# Patient Record
Sex: Male | Born: 1975 | Hispanic: No | Marital: Married | Smoking: Never smoker
Health system: Southern US, Community
[De-identification: ages and names within clinical notes are randomized; demographics above are authoritative.]

## PROBLEM LIST (undated history)

## (undated) DIAGNOSIS — Z8739 Personal history of other diseases of the musculoskeletal system and connective tissue: Secondary | ICD-10-CM

## (undated) DIAGNOSIS — K219 Gastro-esophageal reflux disease without esophagitis: Secondary | ICD-10-CM

## (undated) DIAGNOSIS — I10 Essential (primary) hypertension: Secondary | ICD-10-CM

## (undated) DIAGNOSIS — E785 Hyperlipidemia, unspecified: Secondary | ICD-10-CM

## (undated) HISTORY — DX: Gastro-esophageal reflux disease without esophagitis: K21.9

## (undated) HISTORY — DX: Hyperlipidemia, unspecified: E78.5

## (undated) HISTORY — DX: Personal history of other diseases of the musculoskeletal system and connective tissue: Z87.39

## (undated) HISTORY — DX: Essential (primary) hypertension: I10

---

## 2010-08-29 ENCOUNTER — Emergency Department: Payer: Self-pay | Admitting: Emergency Medicine

## 2011-06-23 ENCOUNTER — Emergency Department: Payer: Self-pay | Admitting: *Deleted

## 2011-06-28 ENCOUNTER — Encounter: Payer: Self-pay | Admitting: Family Medicine

## 2011-06-28 ENCOUNTER — Ambulatory Visit (INDEPENDENT_AMBULATORY_CARE_PROVIDER_SITE_OTHER): Payer: 59 | Admitting: Family Medicine

## 2011-06-28 DIAGNOSIS — M109 Gout, unspecified: Secondary | ICD-10-CM | POA: Insufficient documentation

## 2011-06-28 DIAGNOSIS — I10 Essential (primary) hypertension: Secondary | ICD-10-CM

## 2011-06-28 MED ORDER — COLCHICINE 0.6 MG PO TABS: 0.6000 mg | ORAL_TABLET | Freq: Two times a day (BID) | ORAL | Status: DC

## 2011-06-28 MED ORDER — DICLOFENAC SODIUM 75 MG PO TBEC: 75.0000 mg | DELAYED_RELEASE_TABLET | Freq: Two times a day (BID) | ORAL | Status: DC

## 2011-06-28 NOTE — Progress Notes (Signed)
  Subjective:    Patient ID: Devon Chavez, male    DOB: 08/07/1976, 35 y.o.   MRN: [id number]  HPI  Devon Chavez, a 35 y.o. male presents today in the office for the following:    Had hivess and broke out in an allergic reaction. Whelped up and got really red, and had some itching between his fingers and had some hives on his arms also. None on face and tongue.   Gout is hurting him really bad on his left toe. Longstanding problem, was taking Indocin prn. Flares were 1-2 times a year, now increased to 3-5 times in the last 6 months.  HTN: Tolerating all medications without side effects No CP, no sob. No HA.  BP Readings from Last 3 Encounters:  06/28/11 140/90    The PMH, PSH, Social History, Family History, Medications, and allergies have been reviewed in [facility], and have been updated if relevant.   Review of Systems ROS: GEN: No acute illnesses, no fevers, chills. GI: No n/v/d, eating normally Pulm: No SOB Interactive and getting along well at home.  Otherwise, ROS is as per the HPI.     Objective:   Physical Exam   Physical Exam  Blood pressure 140/90, pulse 97, temperature 98.4 F (36.9 C), temperature source Oral, height 5\' 11"  (1.803 m), weight 201 lb (91.173 kg), SpO2 98.00%.  GEN: WDWN, NAD, Non-toxic, A & O x 3 HEENT: Atraumatic, Normocephalic. Neck supple. No masses, No LAD. Ears and Nose: No external deformity. CV: RRR, No M/G/R. No JVD. No thrill. No extra heart sounds. PULM: CTA B, no wheezes, crackles, rhonchi. No retractions. No resp. distress. No accessory muscle use.  EXTR: No c/c/e. LEFT great toe is tender to palpation. There is no significant redness. Does have minimal movement, which does cause some significant amount of pain. No ecchymosis. NEURO Normal gait.  PSYCH: Normally interactive. Conversant. Not depressed or anxious appearing.  Calm demeanor.        Assessment & Plan:   1. Hypertension  diclofenac (VOLTAREN) 75 MG EC tablet, colchicine  0.6 MG tablet  2. Gout attack  diclofenac (VOLTAREN) 75 MG EC tablet, colchicine 0.6 MG tablet    Continue with lisinopril.  Add oral diclofenac and oral Colcrys  If recurrences cont, would start Allopurinol

## 2011-11-17 ENCOUNTER — Ambulatory Visit: Payer: 59 | Admitting: Family Medicine

## 2011-11-19 ENCOUNTER — Ambulatory Visit: Payer: 59 | Admitting: Family Medicine

## 2011-11-19 ENCOUNTER — Ambulatory Visit (INDEPENDENT_AMBULATORY_CARE_PROVIDER_SITE_OTHER): Payer: 59 | Admitting: Family Medicine

## 2011-11-19 ENCOUNTER — Encounter: Payer: Self-pay | Admitting: Family Medicine

## 2011-11-19 VITALS — BP 120/70 | HR 76 | Temp 98.6°F | Ht 71.0 in | Wt 200.1 lb

## 2011-11-19 DIAGNOSIS — J0100 Acute maxillary sinusitis, unspecified: Secondary | ICD-10-CM

## 2011-11-19 DIAGNOSIS — F410 Panic disorder [episodic paroxysmal anxiety] without agoraphobia: Secondary | ICD-10-CM

## 2011-11-19 MED ORDER — AZITHROMYCIN 250 MG PO TABS: ORAL_TABLET | ORAL | Status: AC

## 2011-11-19 MED ORDER — PAROXETINE HCL 20 MG PO TABS: 20.0000 mg | ORAL_TABLET | ORAL | Status: AC

## 2011-11-19 MED ORDER — AMOXICILLIN 875 MG PO TABS: 875.0000 mg | ORAL_TABLET | Freq: Two times a day (BID) | ORAL | Status: DC

## 2011-11-19 NOTE — Progress Notes (Signed)
Patient Name: Devon Chavez Date of Birth: [birth year] Age: 36 y.o. Medical Record Number: [id number] Gender: male Date of Encounter: 11/19/2011  History of Present Illness:  Devon Chavez is a 36 y.o. very pleasant male patient who presents with the following:  Cannot get head to clear up. Congestion in chest and some pain. Has been ongoing for about 2 weeks. No fever, chills or sweats. Mainly nasal congestion. Now he is feeling worse, he has been sick for greater than 2 weeks. He's having some facial pain the pain more on the LEFT side of his maxillary region. Some pain around his teeth mildly. He also has some fullness in his ears. No significant coughing.  Having some time trouble resting. Not really feeling down. Feeling pulled different ways. Getting short tempered and ill -- now bothering him a lot more. Not been going on all that long, but in the past had some simiilar type of thing in the past. Took something in the past, and worries a lot. Several instances, felt some panic symptoms. 03/2011 --- went to the hospital and was worried so much. Feels anxious much and most of the time.  Past Medical History, Surgical History, Social History, Family History, Problem List, Medications, and Allergies have been reviewed and updated if relevant.  Review of Systems: ROS: GEN: Acute illness details above GI: Tolerating PO intake GU: maintaining adequate hydration and urination Pulm: No SOB Interactive and getting along well at home.  Otherwise, ROS is as per the HPI.   Physical Examination: Filed Vitals:   11/19/11 1121  BP: 120/70  Pulse: 76  Temp: 98.6 F (37 C)  TempSrc: Oral  Height: 5\' 11"  (1.803 m)  Weight: 200 lb 1.9 oz (90.774 kg)  SpO2: 98%    Body mass index is 27.91 kg/(m^2).   Gen: WDWN, NAD; alert,appropriate and cooperative throughout exam  HEENT: Normocephalic and atraumatic. Throat clear, w/o exudate, no LAD, R TM clear, L TM - good landmarks, No fluid present.  rhinnorhea.  Left frontal and maxillary sinuses: Tender, max Right frontal and maxillary sinuses: Tender, max  Neck: No ant or post LAD CV: RRR, No M/G/R Pulm: Breathing comfortably in no resp distress. no w/c/r Abd: S,NT,ND,+BS Extr: no c/c/e Psych: full affect, pleasant   Assessment and Plan:  1. Panic attack   2. Sinusitis, acute maxillary     Orders Today: No orders of the defined types were placed in this encounter.    Medications Today: Meds ordered this encounter  Medications  . DISCONTD: amoxicillin (AMOXIL) 875 MG tablet    Sig: Take 1 tablet (875 mg total) by mouth 2 (two) times daily.    Dispense:  20 tablet    Refill:  0  . PARoxetine (PAXIL) 20 MG tablet    Sig: Take 1 tablet (20 mg total) by mouth every morning.    Dispense:  30 tablet    Refill:  2  . azithromycin (ZITHROMAX Z-PAK) 250 MG tablet    Sig: Take 2 tablets (500 mg) on  Day 1,  followed by 1 tablet (250 mg) once daily on Days 2 through 5.    Dispense:  6 each    Refill:  0    Sinusitis, symptoms greater than 2 weeks. Worsen without improvement. Treat with azithromycin. The patient has had nausea in the past to amoxicillin.  The patient also is having some panic attacks. He is very stressed at work, he does feel anxious much of the time. He may have some  degree of some generalized anxiety as well. He has had several acute panic attacks, and went to the hospital last year thinking that his chest pain. He felt as if he was having a heart attack, but had a negative workup. He has been on some medication in the past, and we decided after discussion that we would place him on Paxil daily. Recheck in one month.

## 2011-11-19 NOTE — Patient Instructions (Addendum)
Loratidine, Zyrtec, or Allegra for allergy  Recheck in 4-5 weeks

## 2011-11-30 ENCOUNTER — Other Ambulatory Visit: Payer: Self-pay | Admitting: Family Medicine

## 2011-11-30 DIAGNOSIS — M109 Gout, unspecified: Secondary | ICD-10-CM

## 2011-11-30 DIAGNOSIS — I10 Essential (primary) hypertension: Secondary | ICD-10-CM

## 2011-11-30 MED ORDER — DICLOFENAC SODIUM 75 MG PO TBEC: 75.0000 mg | DELAYED_RELEASE_TABLET | Freq: Two times a day (BID) | ORAL | Status: AC

## 2011-11-30 MED ORDER — LISINOPRIL 20 MG PO TABS: 20.0000 mg | ORAL_TABLET | Freq: Every day | ORAL | Status: DC

## 2011-11-30 MED ORDER — OMEPRAZOLE 20 MG PO CPDR: 20.0000 mg | DELAYED_RELEASE_CAPSULE | Freq: Every day | ORAL | Status: DC

## 2011-12-22 ENCOUNTER — Ambulatory Visit: Payer: 59 | Admitting: Family Medicine

## 2011-12-22 DIAGNOSIS — Z0289 Encounter for other administrative examinations: Secondary | ICD-10-CM

## 2012-02-03 ENCOUNTER — Ambulatory Visit (INDEPENDENT_AMBULATORY_CARE_PROVIDER_SITE_OTHER): Payer: 59 | Admitting: Family Medicine

## 2012-02-03 ENCOUNTER — Encounter: Payer: Self-pay | Admitting: Family Medicine

## 2012-02-03 VITALS — BP 140/80 | HR 86 | Temp 98.4°F | Wt 200.0 lb

## 2012-02-03 DIAGNOSIS — M2550 Pain in unspecified joint: Secondary | ICD-10-CM

## 2012-02-03 DIAGNOSIS — A938 Other specified arthropod-borne viral fevers: Secondary | ICD-10-CM

## 2012-02-03 MED ORDER — DOXYCYCLINE HYCLATE 100 MG PO TABS: 100.0000 mg | ORAL_TABLET | Freq: Two times a day (BID) | ORAL | Status: AC

## 2012-02-03 MED ORDER — OMEPRAZOLE 40 MG PO CPDR: 40.0000 mg | DELAYED_RELEASE_CAPSULE | Freq: Every day | ORAL | Status: DC

## 2012-02-03 NOTE — Progress Notes (Signed)
  Patient Name: Devon Chavez Date of Birth: 1976-05-03 Age: 36 y.o. Medical Record Number: [id number] Gender: male Date of Encounter: 02/03/2012  History of Present Illness:  Devon Chavez is a 36 y.o. very pleasant male patient who presents with the following:  Achy all over and pain in arms and fingers. Hands felt like that was asleep. Head and chest and legs are hurting.  Threw up last night.  Had some diarrhea for a few days.  Tick was on at least 2 days, pulled off a few days ago No rash AF now + HA  No URI sx, no gi sx  Past Medical History, Surgical History, Social History, Family History, Problem List, Medications, and Allergies have been reviewed and updated if relevant.  Review of Systems: ROS: GEN: Acute illness details above GI: Tolerating PO intake GU: maintaining adequate hydration and urination Pulm: No SOB Interactive and getting along well at home.  Otherwise, ROS is as per the HPI.   Physical Examination: Filed Vitals:   02/03/12 1014  BP: 140/80  Pulse: 86  Temp: 98.4 F (36.9 C)  TempSrc: Tympanic  Weight: 200 lb (90.719 kg)  SpO2: 98%    There is no height on file to calculate BMI.   GEN: WDWN, NAD, Non-toxic, A & O x 3 HEENT: Atraumatic, Normocephalic. Neck supple. No masses, No LAD. Ears and Nose: No external deformity. CV: RRR, No M/G/R. No JVD. No thrill. No extra heart sounds. PULM: CTA B, no wheezes, crackles, rhonchi. No retractions. No resp. distress. No accessory muscle use. EXTR: No c/c/e NEURO Normal gait.  PSYCH: Normally interactive. Conversant. Not depressed or anxious appearing.  Calm demeanor.    Assessment and Plan:  1. Tick-borne fever   2. Arthralgia    Concern for potential tick-borne illness, will cover with doxy  Orders Today: No orders of the defined types were placed in this encounter.    Medications Today: Meds ordered this encounter  Medications  . doxycycline (VIBRA-TABS) 100 MG tablet    Sig: Take 1  tablet (100 mg total) by mouth 2 (two) times daily.    Dispense:  20 tablet    Refill:  0  . DISCONTD: omeprazole (PRILOSEC) 40 MG capsule    Sig: Take 40 mg by mouth daily.  Marland Kitchen omeprazole (PRILOSEC) 40 MG capsule    Sig: Take 1 capsule (40 mg total) by mouth daily.    Dispense:  90 capsule    Refill:  3

## 2012-04-28 ENCOUNTER — Other Ambulatory Visit: Payer: Self-pay | Admitting: Family Medicine

## 2012-04-28 ENCOUNTER — Other Ambulatory Visit: Payer: Self-pay

## 2012-04-28 NOTE — Telephone Encounter (Signed)
Pt request refill Lisinopril; pt out of med. Refill done today to [facility]. Pt notified while on phone.

## 2012-12-04 ENCOUNTER — Other Ambulatory Visit: Payer: Self-pay | Admitting: Family Medicine

## 2013-05-14 ENCOUNTER — Other Ambulatory Visit: Payer: Self-pay | Admitting: Family Medicine

## 2013-05-14 NOTE — Telephone Encounter (Signed)
Ok to refill #90, 0 ref  Only once, help sched f/u please

## 2013-05-14 NOTE — Telephone Encounter (Signed)
Last office visit 02/03/2012.  Do you want to refill this?

## 2013-05-14 NOTE — Telephone Encounter (Signed)
Patient notified refill for his Lisinopril has been sent to pharmacy.  Advised that he is past due for his annual physical.  Patient would like to call back and schedule because he is out of town right now. I advised him that Dr. Patsy Lager has sent in a 90 day supply so he would need to be seen within three month to get any further refills.  Patient states understanding.

## 2015-04-01 ENCOUNTER — Other Ambulatory Visit: Payer: Self-pay | Admitting: Family Medicine

## 2017-08-24 ENCOUNTER — Other Ambulatory Visit: Payer: Self-pay | Admitting: Sports Medicine

## 2017-08-24 DIAGNOSIS — S8992XA Unspecified injury of left lower leg, initial encounter: Secondary | ICD-10-CM

## 2017-09-08 ENCOUNTER — Ambulatory Visit
Admission: RE | Admit: 2017-09-08 | Discharge: 2017-09-08 | Disposition: A | Discharge status: Home / Self Care | Payer: BLUE CROSS/BLUE SHIELD | Source: Ambulatory Visit | Attending: Sports Medicine | Admitting: Sports Medicine

## 2017-09-08 ENCOUNTER — Ambulatory Visit: Admission: RE | Admit: 2017-09-08 | Payer: BLUE CROSS/BLUE SHIELD | Source: Ambulatory Visit

## 2017-09-08 DIAGNOSIS — M25462 Effusion, left knee: Secondary | ICD-10-CM | POA: Insufficient documentation

## 2017-09-08 DIAGNOSIS — S8992XA Unspecified injury of left lower leg, initial encounter: Secondary | ICD-10-CM | POA: Diagnosis present

## 2017-09-08 DIAGNOSIS — M2392 Unspecified internal derangement of left knee: Secondary | ICD-10-CM | POA: Insufficient documentation

## 2018-02-08 ENCOUNTER — Encounter: Payer: Self-pay | Admitting: Emergency Medicine

## 2018-02-08 ENCOUNTER — Emergency Department
Admission: EM | Admit: 2018-02-08 | Discharge: 2018-02-08 | Disposition: A | Discharge status: Home / Self Care | Payer: BLUE CROSS/BLUE SHIELD | Attending: Student in an Organized Health Care Education/Training Program | Admitting: Student in an Organized Health Care Education/Training Program

## 2018-02-08 ENCOUNTER — Emergency Department: Payer: BLUE CROSS/BLUE SHIELD

## 2018-02-08 ENCOUNTER — Other Ambulatory Visit: Payer: Self-pay

## 2018-02-08 DIAGNOSIS — I10 Essential (primary) hypertension: Secondary | ICD-10-CM | POA: Diagnosis not present

## 2018-02-08 DIAGNOSIS — Z79899 Other long term (current) drug therapy: Secondary | ICD-10-CM | POA: Insufficient documentation

## 2018-02-08 DIAGNOSIS — R51 Headache: Secondary | ICD-10-CM | POA: Diagnosis not present

## 2018-02-08 DIAGNOSIS — R0789 Other chest pain: Secondary | ICD-10-CM | POA: Diagnosis not present

## 2018-02-08 DIAGNOSIS — R519 Headache, unspecified: Secondary | ICD-10-CM

## 2018-02-08 LAB — CBC
HCT: 48.3 % (ref 40.0–52.0)
Hemoglobin: 17.6 g/dL (ref 13.0–18.0)
MCH: 35.8 pg — ABNORMAL HIGH (ref 26.0–34.0)
MCHC: 36.4 g/dL — AB (ref 32.0–36.0)
MCV: 98.4 fL (ref 80.0–100.0)
PLATELETS: 195 10*3/uL (ref 150–440)
RBC: 4.91 MIL/uL (ref 4.40–5.90)
RDW: 13.3 % (ref 11.5–14.5)
WBC: 6.4 10*3/uL (ref 3.8–10.6)

## 2018-02-08 LAB — BASIC METABOLIC PANEL
Anion gap: 9 (ref 5–15)
BUN: 14 mg/dL (ref 6–20)
CO2: 23 mmol/L (ref 22–32)
CREATININE: 0.93 mg/dL (ref 0.61–1.24)
Calcium: 9.3 mg/dL (ref 8.9–10.3)
Chloride: 102 mmol/L (ref 101–111)
GFR calc Af Amer: >60 mL/min (ref >60)
GFR calc non Af Amer: >60 mL/min (ref >60)
GLUCOSE: 126 mg/dL — AB (ref 65–99)
Potassium: 4.2 mmol/L (ref 3.5–5.1)
Sodium: 134 mmol/L — ABNORMAL LOW (ref 135–145)

## 2018-02-08 LAB — TROPONIN I

## 2018-02-08 MED ORDER — BUTALBITAL-APAP-CAFFEINE 50-325-40 MG PO TABS: 1.0000 | ORAL_TABLET | Freq: Four times a day (QID) | ORAL | 0 refills | Status: AC | PRN

## 2018-02-08 MED ORDER — LORAZEPAM 1 MG PO TABS
1.0000 mg | ORAL_TABLET | Freq: Once | ORAL | Status: AC
  Administered 2018-02-08: 1 mg via ORAL
  Filled 2018-02-08: qty 1

## 2018-02-08 MED ORDER — PROCHLORPERAZINE EDISYLATE 10 MG/2ML IJ SOLN
10.0000 mg | Freq: Once | INTRAMUSCULAR | Status: AC
  Administered 2018-02-08: 10 mg via INTRAVENOUS
  Filled 2018-02-08: qty 2

## 2018-02-08 MED ORDER — KETOROLAC TROMETHAMINE 30 MG/ML IJ SOLN
15.0000 mg | Freq: Once | INTRAMUSCULAR | Status: AC
  Administered 2018-02-08: 15 mg via INTRAVENOUS
  Filled 2018-02-08: qty 1

## 2018-02-08 MED ORDER — DIPHENHYDRAMINE HCL 50 MG/ML IJ SOLN
12.5000 mg | Freq: Once | INTRAMUSCULAR | Status: AC
  Administered 2018-02-08: 12.5 mg via INTRAVENOUS
  Filled 2018-02-08: qty 1

## 2018-02-08 NOTE — ED Notes (Signed)
Patient transported to CT 

## 2018-02-08 NOTE — Discharge Instructions (Signed)

## 2018-02-08 NOTE — ED Notes (Signed)
Iv removed from right ac prior to discharge, catheter intact when removed, site clean dry, intact.

## 2018-02-08 NOTE — ED Notes (Signed)
Pt back from CT at this time 

## 2018-02-08 NOTE — ED Triage Notes (Signed)
Pt states he was diagnosed with a Staph infection in his sinuses yesterday, started an antibiotic last pm. Also c/o bad headache, left sided chest pain and dizziness.  States he has had anxiety in the past. NAD at this time.

## 2018-02-08 NOTE — ED Notes (Signed)
First Nurse Note:  Patient complaining of right sided HA, blurry vision, and dizziness.  HA X 2 weeks.  Accompanied by wife.  States they saw Dr. Tami Ribas yesterday and patient has a staph infection.

## 2018-02-08 NOTE — ED Provider Notes (Signed)
[facility] Emergency Department Provider Note    First MD Initiated Contact with Patient 02/08/18 [phone]     (approximate)  I have reviewed the triage vital signs and the nursing notes.   HISTORY  Chief Complaint Chest Pain; Dizziness; and Headache    HPI Devon Chavez is a 42 y.o. male with a history of anxiety and panic attacks presents to the ER with chief complaint of several weeks of right-sided headache as well as feeling of uneasiness intermittent lightheadedness and feel that is about to pass out.  States that he is also is noted things that when he starts driving down he will he gets very nervous and anxious.  States he has seen his primary doctor about this and was given a trial of antibiotics for sinusitis and even saw an ear nose and throat doctor yesterday and was told that he had persistent sinusitis  and was told to continue antibiotics.  Was recently started on Lexapro by his PCP.  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 Hypertension    No family history on file. History reviewed. No pertinent surgical history. Patient Active Problem List   Diagnosis Date Noted  . Panic attack 11/19/2011  . Hypertension 06/28/2011  . Gout attack 06/28/2011      Prior to Admission medications   Medication Sig Start Date End Date Taking? Authorizing Provider  colchicine 0.6 MG tablet Take 1 tablet (0.6 mg total) by mouth 2 (two) times daily. 06/28/11 06/27/12  Copland, Frederico Hamman, MD  COLCRYS 0.6 MG tablet TAKE 1 TABLET BY MOUTH TWICE A DAY 12/04/12   Copland, Frederico Hamman, MD  diclofenac (VOLTAREN) 75 MG EC tablet TAKE 1 TABLET BY MOUTH TWICE A DAY 12/04/12   Copland, Frederico Hamman, MD  lisinopril (PRINIVIL,ZESTRIL) 20 MG tablet TAKE 1 TABLET ONCE A DAY 05/14/13   Copland, Frederico Hamman, MD  omeprazole (PRILOSEC) 40 MG capsule Take 1 capsule  (40 mg total) by mouth daily. 02/03/12   Owens Loffler, MD    Allergies Patient has no known allergies.    Social History Social History   Tobacco Use  . Smoking status: Never Smoker  . Smokeless tobacco: Current User  Substance Use Topics  . Alcohol use: Yes  . Drug use: No    Review of Systems Patient denies headaches, rhinorrhea, blurry vision, numbness, shortness of breath, chest pain, edema, cough, abdominal pain, nausea, vomiting, diarrhea, dysuria, fevers, rashes or hallucinations unless otherwise stated above in HPI. ____________________________________________   PHYSICAL EXAM:  VITAL SIGNS: There were no vitals filed for this visit.  Constitutional: Alert and oriented but very anxious appearing grabbing the top of his head Eyes: Conjunctivae are normal.  Head: Atraumatic. Nose: No congestion/rhinnorhea. Mouth/Throat: Mucous membranes are moist.   Neck: No stridor. Painless ROM.  Cardiovascular: Normal rate, regular rhythm. Grossly normal heart sounds.  Good peripheral circulation. Respiratory: Normal respiratory effort.  No retractions. Lungs CTAB. Gastrointestinal: Soft and nontender. No distention. No abdominal bruits. No CVA tenderness. Genitourinary:  Musculoskeletal: No lower extremity tenderness nor edema.  No joint effusions. Neurologic:  Normal speech and language. No gross focal neurologic deficits are appreciated. No facial droop Skin:  Skin is warm, dry and intact. No rash noted. Psychiatric: anxious appearing ____________________________________________   LABS (all labs ordered are listed, but only abnormal results are displayed)  No results found for this or any previous visit (from the past 24 hour(s)). ____________________________________________  EKG My review and personal interpretation at Time: 9:27   Indication: headache  Rate: 115  Rhythm: sinus Axis: normal Other: normal intervals, no  stemi ____________________________________________  RADIOLOGY  I personally reviewed all radiographic images ordered to evaluate for the above acute complaints and reviewed radiology reports and findings.  These findings were personally discussed with the patient.  Please see medical record for radiology report.  ____________________________________________   PROCEDURES  Procedure(s) performed:  Procedures    Critical Care performed: no ____________________________________________   INITIAL IMPRESSION / ASSESSMENT AND PLAN / ED COURSE  Pertinent labs & imaging results that were available during my care of the patient were reviewed by me and considered in my medical decision making (see chart for details).   DDX: tension, anxiety, panic attack, mass, sinusisits, abscess  Devon Chavez is a 42 y.o. who presents to the ED with symptoms as described above.  Patient very anxious appearing.  No focal neuro deficits.  Currently on appropriate antibiotics for sinusitis.  CT imaging ordered to exclude mass or abscess.  CT imaging reassuring and in light of his reassuring neuro exam, do not feel that emergent MRI clinically indicated.  Patient given migraine cocktail with improvement in symptoms.  Do suspect large component as a secondary to anxiety and stress.  Not clinically consistent with unstable angina or ACS.  No signs or symptoms of dissection or PE.   Will provide IV migraine cocktail   Clinical Course as of Feb 08 1210  Wed Feb 08, 2018  1208 Patient reassessed and is feeling better.  He is able to ambulate to the bathroom with a steady gait.  Repeat neuro exam is nonfocal.  At this point I do not feel it further diagnostic testing clinically indicated.  Patient stable and appropriate for further outpatient follow-up.  Have discussed with the patient and available family all diagnostics and treatments performed thus far and all questions were answered to the best of my ability. The  patient demonstrates understanding and agreement with plan.    [PR]    Clinical Course User Index [PR] Merlyn Lot, MD     As part of my medical decision making, I reviewed the following data within the [facility] notes reviewed and incorporated, Labs reviewed, notes from prior ED visits.   ____________________________________________   FINAL CLINICAL IMPRESSION(S) / ED DIAGNOSES  Final diagnoses:  Bad headache  Atypical chest pain      NEW MEDICATIONS STARTED DURING THIS VISIT:  New Prescriptions   No medications on file     Note:  This document was prepared using Dragon voice recognition software and may include unintentional dictation errors.    Merlyn Lot, MD 02/08/18 [phone]

## 2018-06-30 IMAGING — MR MR KNEE*L* W/O CM
6 series · 39 of 40 positions shown · non-contrast
Comparison: None.

CLINICAL DATA: Status post fall in the snow 3 weeks ago.

EXAM:
MRI OF THE LEFT KNEE WITHOUT CONTRAST
TECHNIQUE: Multiplanar, multisequence MR imaging of the knee was performed. No
intravenous contrast was administered.

[Series 3: PD fat-sat · axial · 3.0mm · 0.33mm/px · z∈[-68,+44]mm · 8 of 35 slices shown (1 of 4)]
[im 1/35]
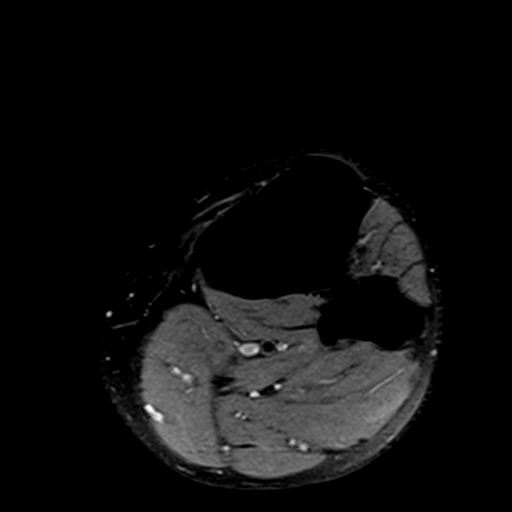
[im 5/35]
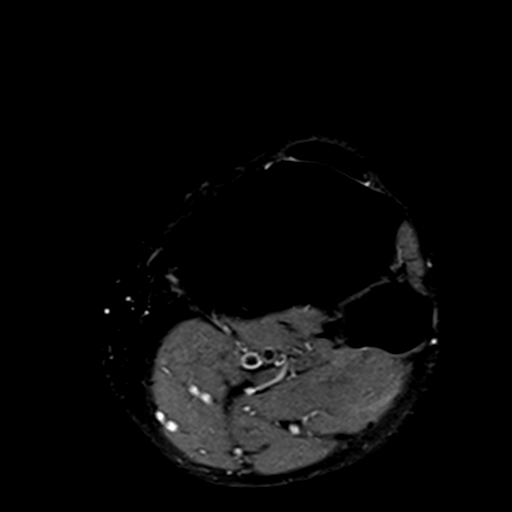
[im 10/35]
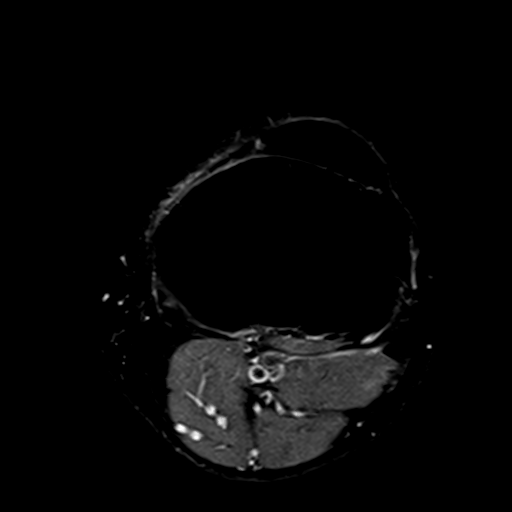
[im 15/35]
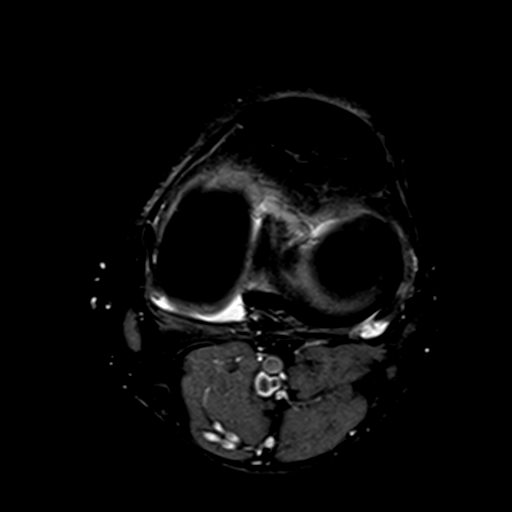
[im 20/35]
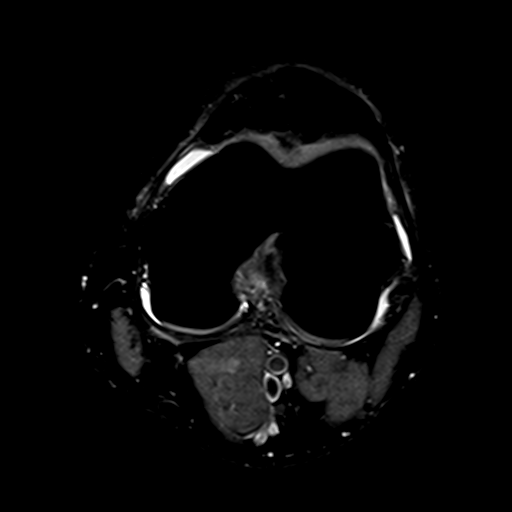
[im 25/35]
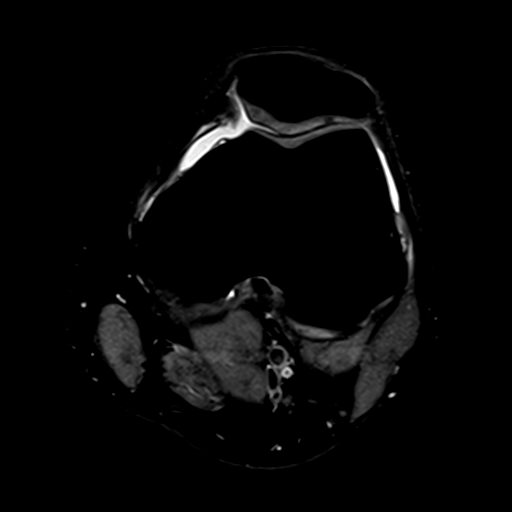
[im 30/35]
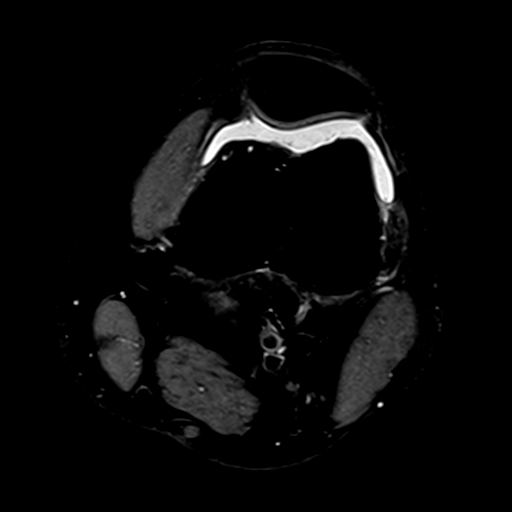
[im 35/35]
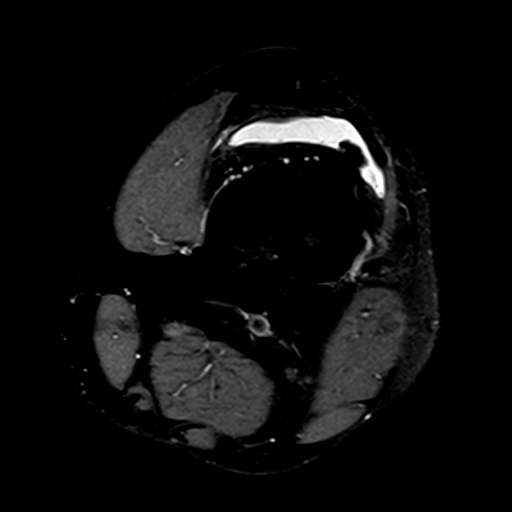

[Series 4: T1 · coronal · 3.0mm · 0.50mm/px · 6 of 34 slices shown]
[im 1/34]
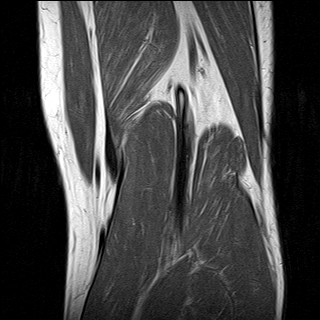
[im 6/34]
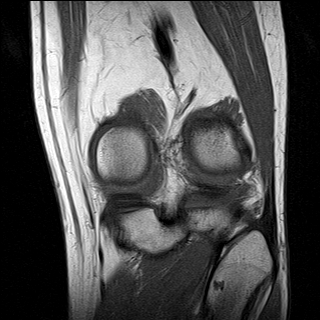
[im 12/34]
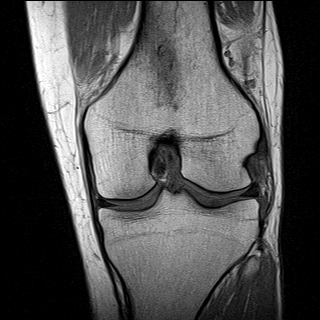
[im 17/34]
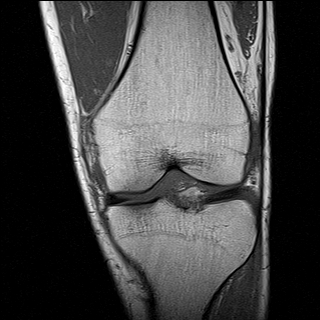
[im 23/34]
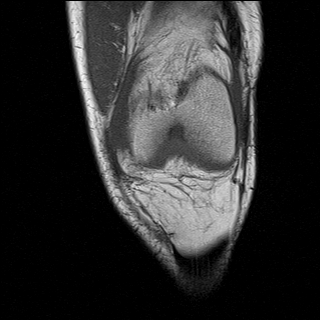
[im 28/34]
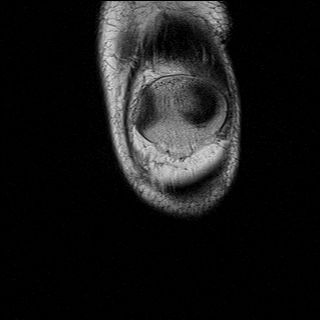

[Series 5: T2 fat-sat · coronal · 3.0mm · 0.50mm/px · 7 of 34 slices shown]
[im 1/34]
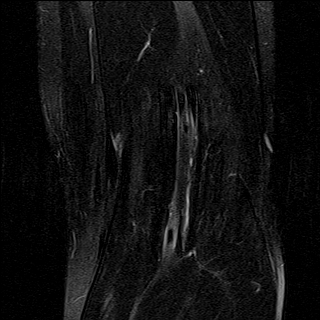
[im 6/34]
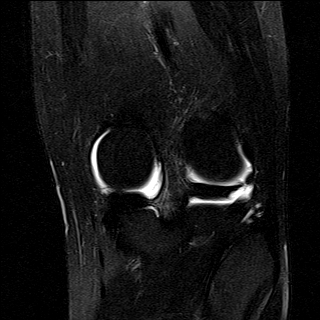
[im 12/34]
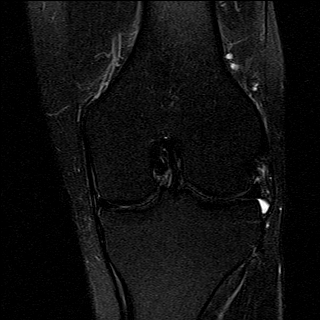
[im 17/34]
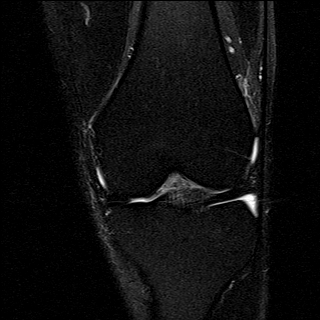
[im 23/34]
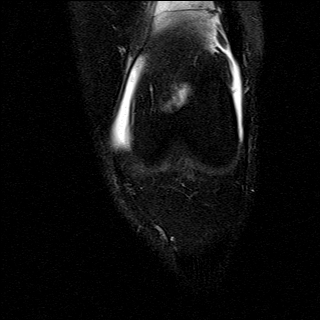
[im 28/34]
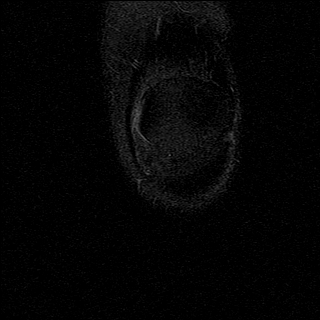
[im 34/34]
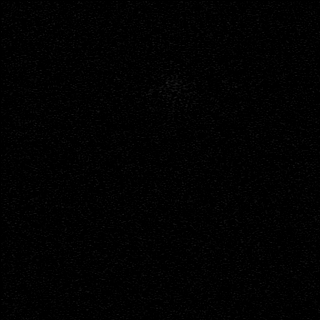

[Series 6: PD fat-sat · coronal · 3.0mm · 0.62mm/px · 7 of 34 slices shown (2 of 4)]
[im 1/34]
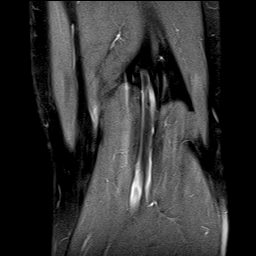
[im 6/34]
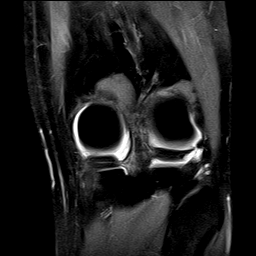
[im 12/34]
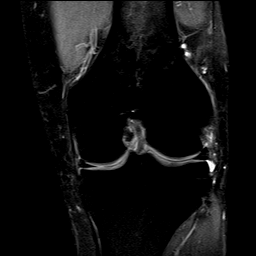
[im 17/34]
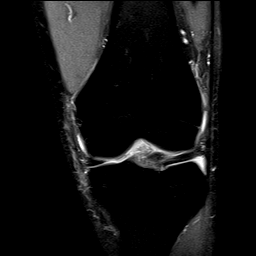
[im 23/34]
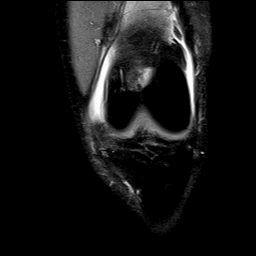
[im 28/34]
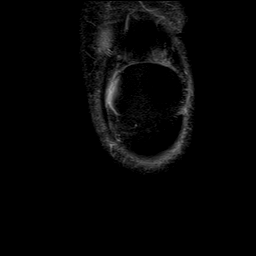
[im 34/34]
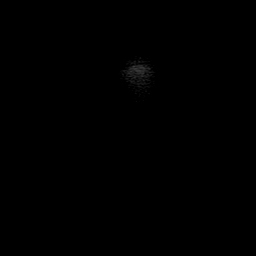

[Series 7: PD fat-sat · sagittal · 3.0mm · 0.62mm/px · 7 of 35 slices shown (3 of 4)]
[im 1/35]
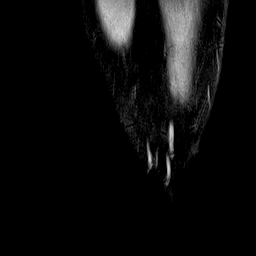
[im 6/35]
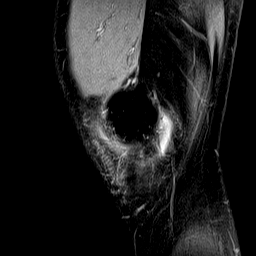
[im 12/35]
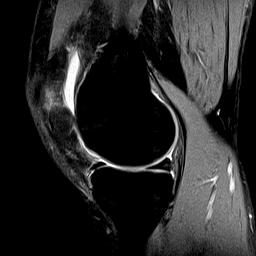
[im 18/35]
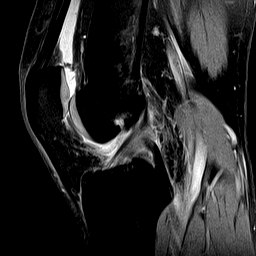
[im 23/35]
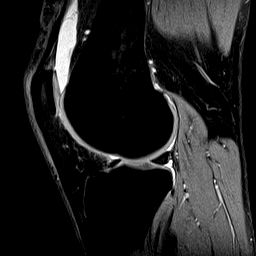
[im 29/35]
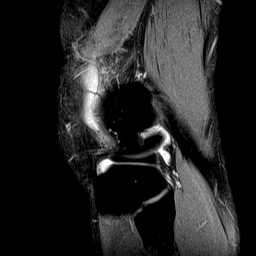
[im 35/35]
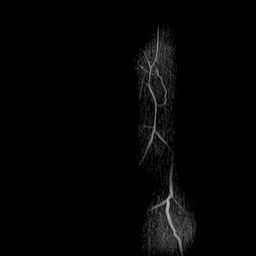

[Series 8: PD fat-sat · coronal · 2.0mm · 0.62mm/px · 4 of 19 slices shown (4 of 4)]
[im 1/19]
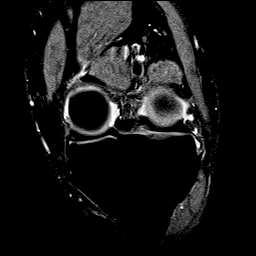
[im 7/19]
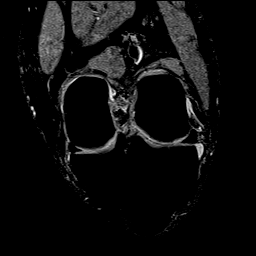
[im 13/19]
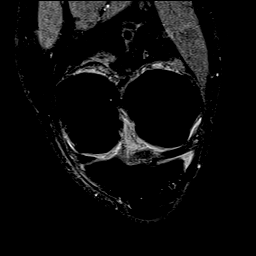
[im 19/19]
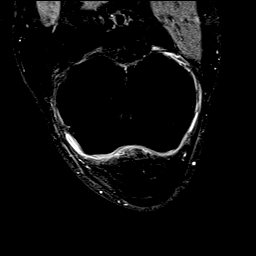

[39 of 40 positions shown; findings below may reference images not displayed]

FINDINGS: MENISCI

Medial meniscus: Increased signal in the posterior horn- body
junction of the medial meniscus consistent with degeneration. No
discrete tear.

Lateral meniscus: Possible partial radial tear of the posterior horn
of the lateral meniscus adjacent to the root.

LIGAMENTS

Cruciates:  Intact ACL and PCL.

Collaterals: Medial collateral ligament is intact. Lateral
collateral ligament complex is intact.

CARTILAGE

Patellofemoral: Partial-thickness cartilage loss of the lateral
patellar facet.

Medial: Mild partial-thickness cartilage loss of the medial
femorotibial compartment.

Lateral:  No chondral defect.

Joint: Large joint effusion. Normal Hoffa's fat. No plical
thickening.

Popliteal Fossa:  No Baker cyst. Intact popliteus tendon.

Extensor Mechanism: Intact quadriceps tendon and patellar tendon.
Intact medial and lateral patellar retinaculum. Intact MPFL.

Bones: No focal marrow signal abnormality. No fracture or
dislocation.

Other: No fluid collection or hematoma.
IMPRESSION: 1. Possible partial radial tear of the posterior horn of the lateral
meniscus adjacent to the root.
2. Large joint effusion.

## 2018-09-04 DIAGNOSIS — D239 Other benign neoplasm of skin, unspecified: Secondary | ICD-10-CM

## 2018-09-04 HISTORY — DX: Other benign neoplasm of skin, unspecified: D23.9

## 2020-10-10 ENCOUNTER — Ambulatory Visit: Payer: BLUE CROSS/BLUE SHIELD | Admitting: Dietician

## 2020-10-23 ENCOUNTER — Encounter: Payer: Self-pay | Admitting: Dietician

## 2020-10-23 NOTE — Progress Notes (Signed)
Have not heard back from patient to reschedule his missed appointment from 10/10/20. Sent notification to referring provider.

## 2020-11-03 ENCOUNTER — Ambulatory Visit: Payer: BLUE CROSS/BLUE SHIELD | Admitting: Dietician

## 2021-02-06 ENCOUNTER — Encounter: Payer: Self-pay | Admitting: Cardiovascular Disease

## 2021-02-06 ENCOUNTER — Ambulatory Visit (INDEPENDENT_AMBULATORY_CARE_PROVIDER_SITE_OTHER): Payer: BLUE CROSS/BLUE SHIELD | Admitting: Cardiovascular Disease

## 2021-02-06 ENCOUNTER — Other Ambulatory Visit: Payer: Self-pay

## 2021-02-06 VITALS — BP 142/90 | HR 98 | Ht 71.0 in | Wt 204.2 lb

## 2021-02-06 DIAGNOSIS — R0789 Other chest pain: Secondary | ICD-10-CM | POA: Diagnosis not present

## 2021-02-06 DIAGNOSIS — I10 Essential (primary) hypertension: Secondary | ICD-10-CM

## 2021-02-06 DIAGNOSIS — Z8342 Family history of familial hypercholesterolemia: Secondary | ICD-10-CM | POA: Diagnosis not present

## 2021-02-06 NOTE — Progress Notes (Signed)
Cardiology Office Note  Date:  02/06/2021   ID:  Devon Chavez, DOB 1976/01/05, MRN [id number]  PCP:  Devon Body, MD   Chief Complaint  Patient presents with  . New Patient (Initial Visit)    Ref by Dr. Netty Chavez for chest pain. Patient c/o chest tightness/burning and shortness of breath. Medications reviewed by the patient verbally.     HPI:  Mr. Devon Chavez is a 45 year old gentleman with past medical history of Hyperlipidemia Chest pain Situational anxiety Hypertension Who presents by referral from Dr. 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 Active at work  Lifted heavy machine 01/24/21 , onto the back of a truck, couple hundred pounds Since then with some Burning pain in his chest Concerned about his heart  Friend of his with recent blockage requiring bypass surgery, made him anxious  He has  3 girls, married Denies any chest pain with exertion, with no typical anginal symptoms  Weight down 36 pounds, intentional dieting  Nonsmoking Nondiabetic  Total chol 224 to 162 with weight loss Past 40 years typically running 240 or higher on cholesterol  EKG personally reviewed by myself on todays visit Shows normal sinus rhythm rate 98 bpm no significant ST-T wave changes   PMH:   has a past medical history of GERD (gastroesophageal reflux disease), History of gout, Hyperlipidemia, and Hypertension.  PSH:   History reviewed. No pertinent surgical history.  Current Outpatient Medications  Medication Sig Dispense Refill  . allopurinol (ZYLOPRIM) 100 MG tablet Take 100 mg by mouth daily.    Marland Kitchen aspirin EC 81 MG tablet Take 81 mg by mouth daily.    . colchicine 0.6 MG tablet Take 0.6 mg by mouth daily as needed.    Marland Kitchen escitalopram (LEXAPRO) 10 MG tablet Take 10 mg by mouth daily.  1  . fenofibrate 160 MG tablet Take 160 mg by mouth daily.    Marland Kitchen lisinopril (PRINIVIL,ZESTRIL) 20 MG  tablet TAKE 1 TABLET ONCE A DAY 90 tablet 0  . lovastatin (MEVACOR) 20 MG tablet Take 10 mg by mouth every evening.  1  . omeprazole (PRILOSEC OTC) 20 MG tablet Take 20 mg by mouth as needed.     No current facility-administered medications for this visit.    Allergies:   Other   Social History:  The patient  reports that he has never smoked. His smokeless tobacco use includes chew. He reports current alcohol use. He reports that he does not use drugs.   Family History:   family history includes Hypertension in his father and mother.    Review of Systems: Review of Systems  Constitutional: Negative.   HENT: Negative.   Respiratory: Negative.   Cardiovascular: Positive for chest pain.       Describes the sensation as a burning, at rest  Gastrointestinal: Negative.   Musculoskeletal: Negative.   Neurological: Negative.   Psychiatric/Behavioral: Negative.   All other systems reviewed and are negative.   PHYSICAL EXAM: VS:  BP (!) 142/90 (BP Location: Left Arm, Patient Position: Sitting, Cuff Size: Normal)   Pulse 98   Ht 5\' 11"  (1.803 m)   Wt 204 lb 4 oz (92.6 kg)   SpO2 98%   BMI 28.49 kg/m  , BMI Chavez mass index is 28.49 kg/m. GEN: Well nourished, well developed, in no acute distress HEENT: normal Neck: no JVD, carotid bruits, or masses Cardiac: RRR; no murmurs, rubs, or  gallops,no edema  Respiratory:  clear to auscultation bilaterally, normal work of breathing GI: soft, nontender, nondistended, + BS MS: no deformity or atrophy Skin: warm and dry, no rash Neuro:  Strength and sensation are intact Psych: euthymic mood, full affect    Recent Labs: No results found for requested labs within last 8760 hours.    Lipid Panel No results found for: CHOL, HDL, LDLCALC, TRIG    Wt Readings from Last 3 Encounters:  02/06/21 204 lb 4 oz (92.6 kg)  02/08/18 215 lb (97.5 kg)  02/03/12 200 lb (90.7 kg)       ASSESSMENT AND PLAN:  Problem List Items Addressed This  Visit      Cardiology Problems   Hypertension   Relevant Medications   fenofibrate 160 MG tablet   Other Relevant Orders   EKG 12-Lead   CT CARDIAC SCORING (SELF PAY ONLY)    Other Visit Diagnoses    Atypical chest pain    -  Primary   Family history of hypercholesterolemia       Relevant Orders   CT CARDIAC SCORING (SELF PAY ONLY)     Atypical symptoms, not presenting with exertion More of a burning sensation, he reports often relieved by putting his arms over his head in a stretching motion He did lift several 100 pound motor with his friend to get on the back of a truck, may have pulled something in the chest He is receiving treatment from chiropractic, dry needling which may be helping -We have recommended no heavy lifting, treat this as a musculoskeletal disorder -He does have history of hyperlipidemia, We have suggested screening study for risk stratification, CT coronary calcium scoring ordered For any worsening symptoms concerning for angina (which we have discussed with him), additional testing could be ordered such as cardiac CTA  Hyperlipidemia Dramatic improvement with weight loss Recommend he try to maintain his current weight and excellent diet Calcium scoring as above for further guidance For elevated calcium score may need goal LDL less than 70    Total encounter time more than 45 minutes  Greater than 50% was spent in counseling and coordination of care with the patient    Signed, Esmond Plants, M.D., Ph.D. [place], [place]

## 2021-02-06 NOTE — Patient Instructions (Addendum)
Medication Instructions:  No changes  If you need a refill on your cardiac medications before your next appointment, please call your pharmacy.    Lab work: No new labs needed  Testing/Procedures: We have order a CT coronary calcium score (you may see results on your MyChart account, but we will call via phone with the results)  This is a $99 out of pocket expense   This procedure uses special x-ray equipment to produce pictures of the coronary arteries to determine if they are blocked or narrowed by the buildup of plaque - an indicator for atherosclerosis or coronary artery disease (CAD).  Please call [phone] to schedule at your earliest convince   This is done at our [facility] in [facility] [place], [place] [address]    Follow-Up: At [facility], you and your health needs are our priority.  As part of our continuing mission to provide you with exceptional heart care, we have created designated Provider Care Teams.  These Care Teams include your primary Cardiologist (physician) and Advanced Practice Providers (APPs -  Physician Assistants and Nurse Practitioners) who all work together to provide you with the care you need, when you need it.  . You will need a follow up appointment as needed  . Providers on your designated Care Team:   . Murray Hodgkins, NP . Christell Faith, PA-C . Marrianne Mood, PA-C   COVID-19 Vaccine Information can be found at: ShippingScam.co.uk For questions related to vaccine distribution or appointments, please email [email or web address] or call [phone].

## 2021-02-09 ENCOUNTER — Other Ambulatory Visit: Payer: Self-pay

## 2021-02-09 ENCOUNTER — Ambulatory Visit
Admission: RE | Admit: 2021-02-09 | Discharge: 2021-02-09 | Disposition: A | Discharge status: Home / Self Care | Payer: BLUE CROSS/BLUE SHIELD | Source: Ambulatory Visit | Attending: Cardiovascular Disease | Admitting: Cardiovascular Disease

## 2021-02-09 DIAGNOSIS — I10 Essential (primary) hypertension: Secondary | ICD-10-CM | POA: Insufficient documentation

## 2021-02-09 DIAGNOSIS — Z8342 Family history of familial hypercholesterolemia: Secondary | ICD-10-CM

## 2021-02-11 ENCOUNTER — Telehealth: Payer: Self-pay

## 2021-02-11 NOTE — Telephone Encounter (Signed)
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

## 2021-02-11 NOTE — Telephone Encounter (Signed)
Incoming call from pt as this RN called earlier to give results Dr. Rockey Situ had a chance to review his results and advised   Ct coronary calcium score  Score is very low, around 39, should not cause chest pain  But the coronary calcification has started,  In effort to minimize any progression ,  I would consider adding zetia 10 mg daily to lovastatin,  This would push LDL to less than 70 (if weight stays low, where it is now)  Consider signing up for mychart      Ref Range & Units 1 mo ago 12/18/2020  Cholesterol, Total 100 - 200 mg/dL 162   Triglyceride 35 - 199 mg/dL 158   HDL (High Density Lipoprotein) Cholesterol 29.0 - 71.0 mg/dL 60.3   LDL Calculated 0 - 130 mg/dL 70   VLDL Cholesterol mg/dL 32   Cholesterol/HDL Ratio  2.7    Devon Chavez reports at this time will wait on Zetia, will call back if this is a medication he would like to try in effort to minimize any progression. If any future CP will call back for advise and recommendations, otherwise, all questions and concerns were address and no additional concerns at this time. Will call back for anything further.

## 2021-12-01 IMAGING — CT CT CARDIAC CORONARY ARTERY CALCIUM SCORE
3 series · 14 of 20 positions shown, 16 images · non-contrast
Comparison: None.
COMPARISON: None.

Addendum:
EXAM:
OVER-READ INTERPRETATION  CT CHEST

The following report is an over-read performed by radiologist Dr.
Herma Bakshi [REDACTED] on 02/09/2021. This over-read
does not include interpretation of cardiac or coronary anatomy or
pathology. The coronary calcium score interpretation by the
cardiologist is attached.
CLINICAL DATA: Risk stratification
Coronary Calcium Score
TECHNIQUE: The patient was scanned on a Siemens Somatom go.Top Scanner. Axial
non-contrast 3 mm slices were carried out through the heart. The
data set was analyzed on a dedicated work station and scored using
the Agatson method.

[Series 2: sa36 calcium scoring 3.00 · axial · 0.40mm/px · z∈[-1118,-1037]mm · 4 of 46 slices shown]
[im 10/46  vessel]
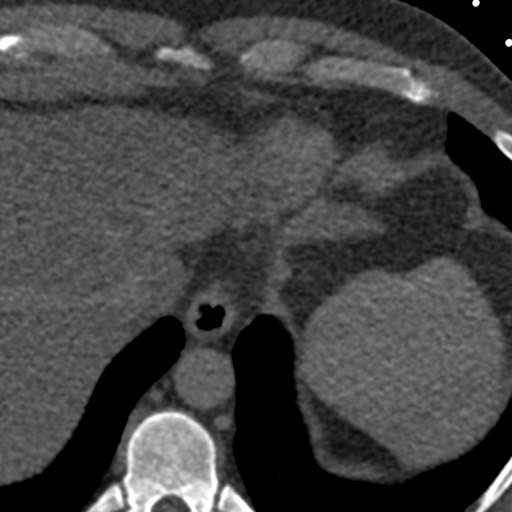
[im 19/46  vessel]
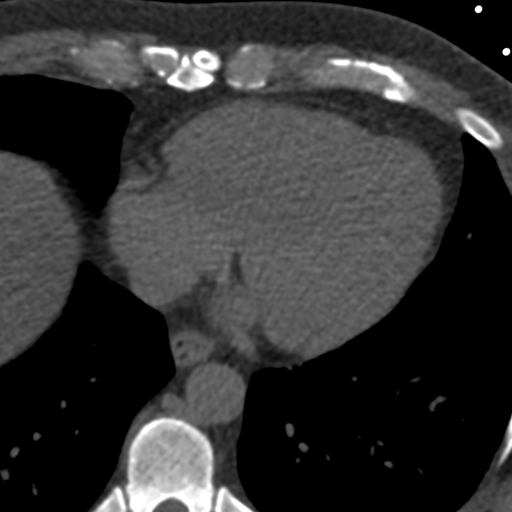
[im 28/46  vessel]
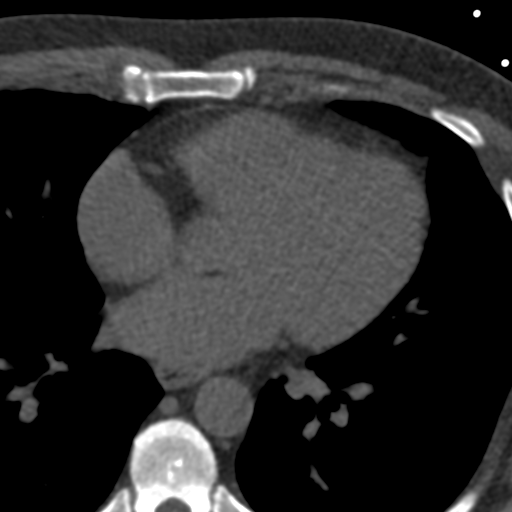
[im 37/46  vessel]
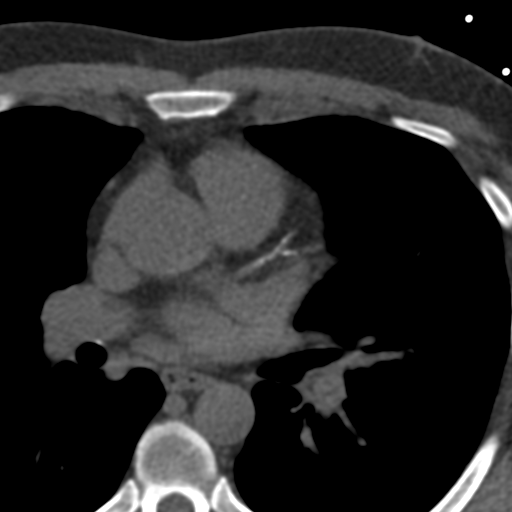

[Series 5: full fov st calcium scoring 3.00 · axial · 0.72mm/px · z∈[-1124,-1034]mm · 5 of 46 slices shown, 7 images]
[im 8/46  vessel]
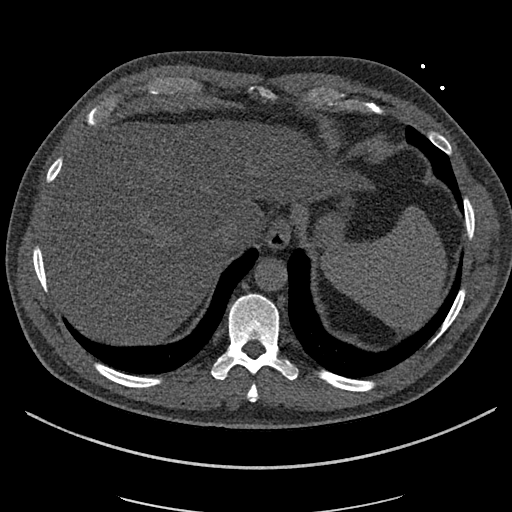
[im 8/46  lung]
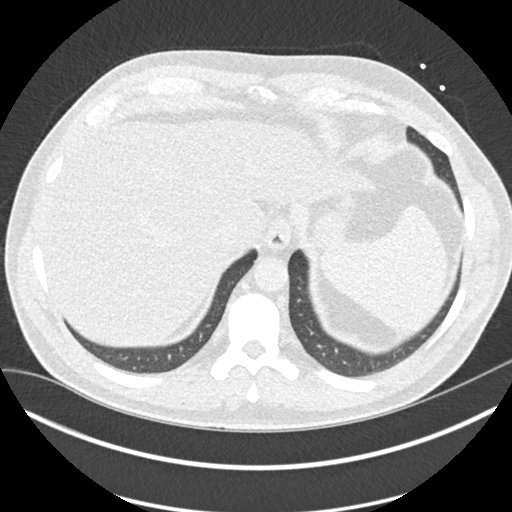
[im 16/46  vessel]
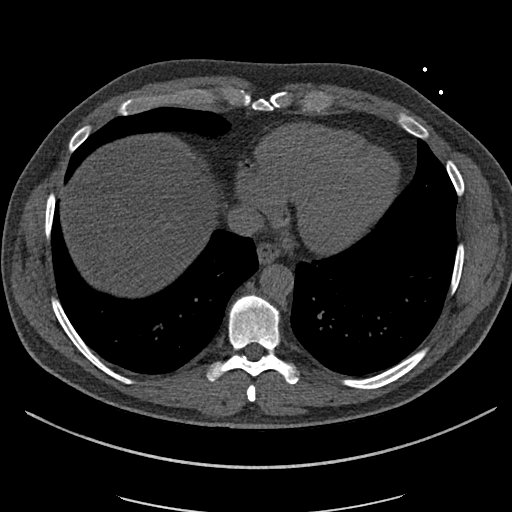
[im 23/46  vessel]
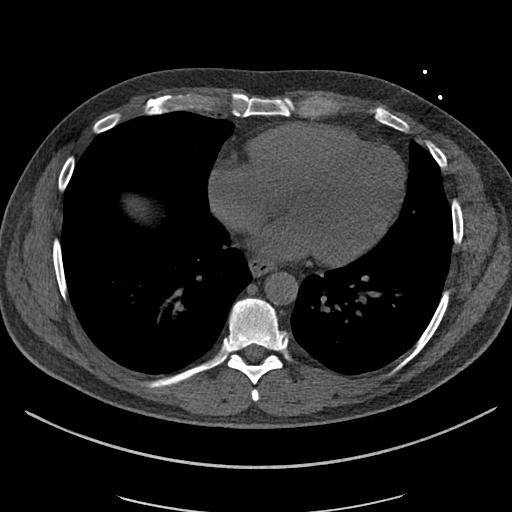
[im 31/46  vessel]
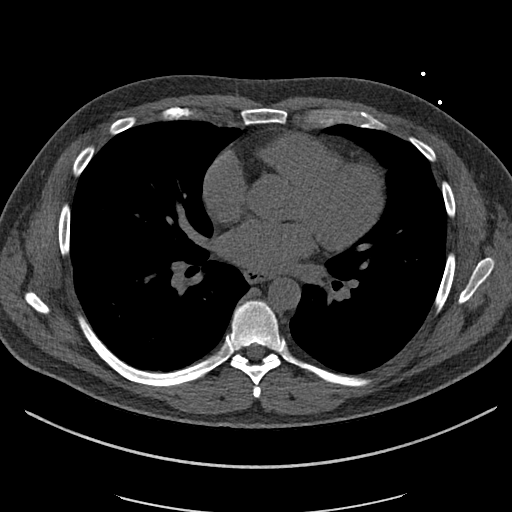
[im 38/46  vessel]
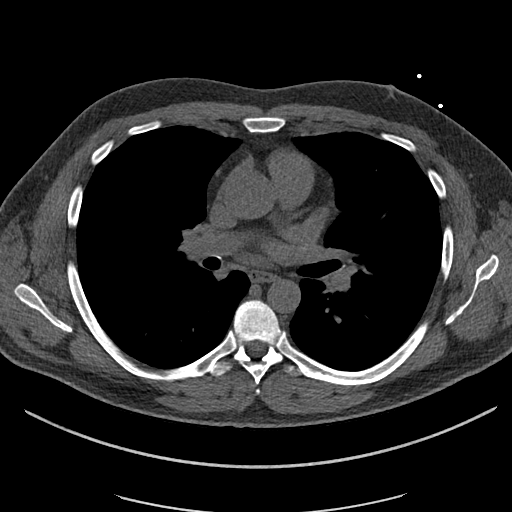
[im 38/46  lung]
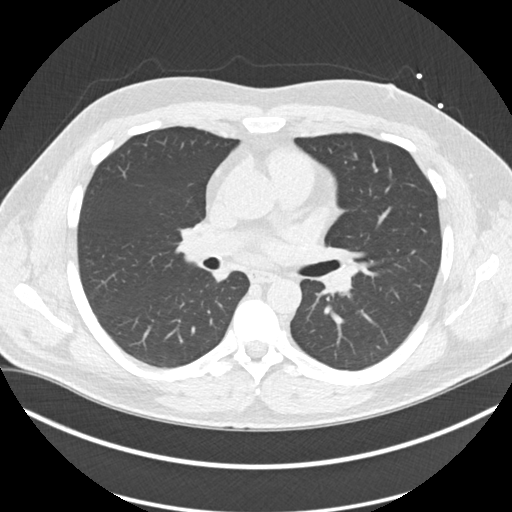

[Series 10: full fov lungs calcium scoring 3.00 ax · axial · 0.72mm/px · z∈[-1124,-1034]mm · 5 of 46 slices shown]
[im 8/46  vessel]
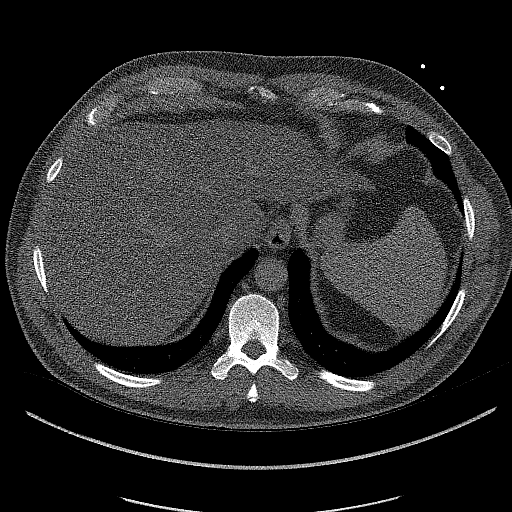
[im 16/46  vessel]
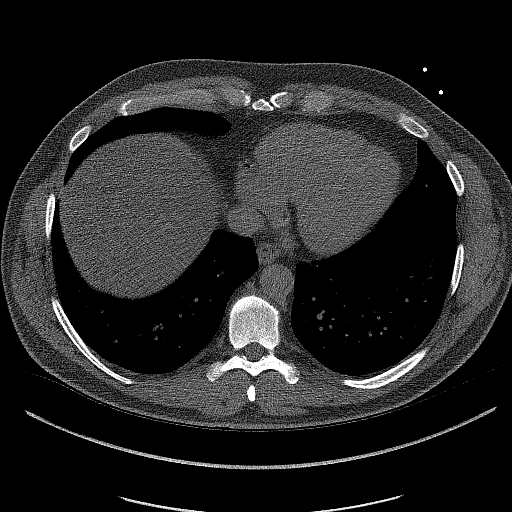
[im 23/46  vessel]
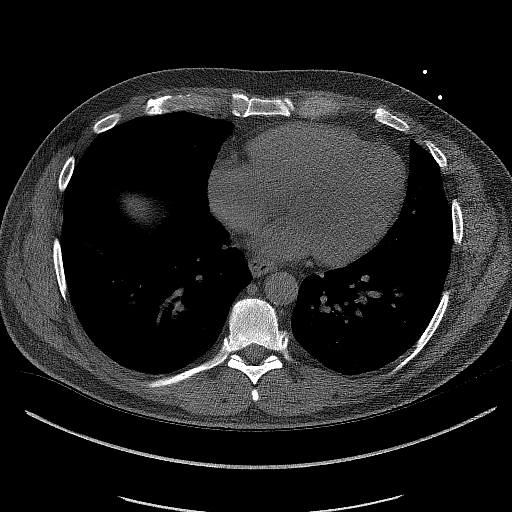
[im 31/46  vessel]
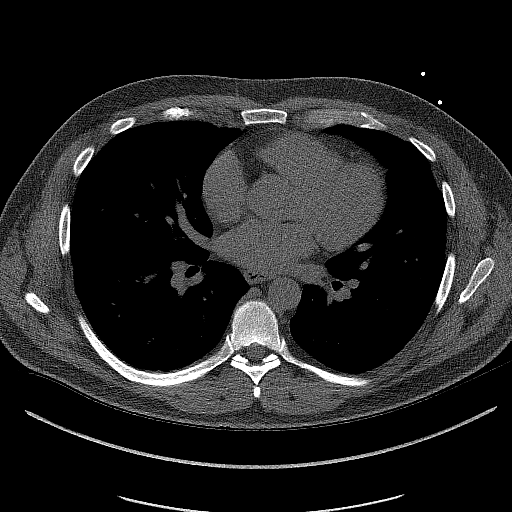
[im 38/46  vessel]
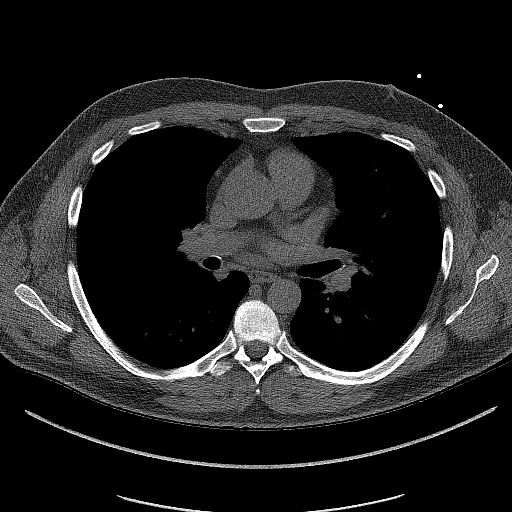

[14 of 20 positions shown; findings below may reference images not displayed]

FINDINGS: Vascular: Heart is normal size.  Aorta normal caliber.

Mediastinum/Nodes: No adenopathy

Lungs/Pleura: Visualized lungs clear.  No effusions.

Upper Abdomen: Diffuse low-density throughout the liver compatible
with fatty infiltration.

Musculoskeletal: Chest wall soft tissues are unremarkable. No acute
bony abnormality.
IMPRESSION: No acute extra cardiac abnormality.

Hepatic steatosis.
FINDINGS: Non-cardiac: See separate report from [REDACTED].

Ascending Aorta: Normal size

Pericardium: Normal

Coronary arteries: Normal origin of left and right coronary
arteries. Distribution of arterial calcifications if present, as
noted below;

LM 0

LAD

LCx

RCA

Total

IMPRESSION AND RECOMMENDATION:
1. Coronary calcium score of 39.9. This was 90th percentile for age
and sex matched control.

2. CAC 1-99 in LAD, LCx, RCA. ALECIA SRIVASTAVA1/N3

3. Continue heart healthy lifestyle and risk factor modification.

Maria Fernanda Jong

*** End of Addendum ***
EXAM:
OVER-READ INTERPRETATION  CT CHEST

The following report is an over-read performed by radiologist Dr.
Herma Bakshi [REDACTED] on 02/09/2021. This over-read
does not include interpretation of cardiac or coronary anatomy or
pathology. The coronary calcium score interpretation by the
cardiologist is attached.
FINDINGS: Vascular: Heart is normal size.  Aorta normal caliber.

Mediastinum/Nodes: No adenopathy

Lungs/Pleura: Visualized lungs clear.  No effusions.

Upper Abdomen: Diffuse low-density throughout the liver compatible
with fatty infiltration.

Musculoskeletal: Chest wall soft tissues are unremarkable. No acute
bony abnormality.
IMPRESSION: No acute extra cardiac abnormality.

Hepatic steatosis.

## 2022-06-30 ENCOUNTER — Encounter: Payer: Self-pay | Admitting: Dermatology

## 2022-06-30 ENCOUNTER — Ambulatory Visit (INDEPENDENT_AMBULATORY_CARE_PROVIDER_SITE_OTHER): Payer: Commercial Managed Care - HMO | Admitting: Dermatology

## 2022-06-30 DIAGNOSIS — Z86018 Personal history of other benign neoplasm: Secondary | ICD-10-CM

## 2022-06-30 DIAGNOSIS — L820 Inflamed seborrheic keratosis: Secondary | ICD-10-CM

## 2022-06-30 DIAGNOSIS — L578 Other skin changes due to chronic exposure to nonionizing radiation: Secondary | ICD-10-CM | POA: Diagnosis not present

## 2022-06-30 DIAGNOSIS — L7211 Pilar cyst: Secondary | ICD-10-CM

## 2022-06-30 DIAGNOSIS — Z1283 Encounter for screening for malignant neoplasm of skin: Secondary | ICD-10-CM

## 2022-06-30 DIAGNOSIS — D229 Melanocytic nevi, unspecified: Secondary | ICD-10-CM

## 2022-06-30 DIAGNOSIS — L814 Other melanin hyperpigmentation: Secondary | ICD-10-CM

## 2022-06-30 DIAGNOSIS — L821 Other seborrheic keratosis: Secondary | ICD-10-CM

## 2022-06-30 NOTE — Progress Notes (Signed)
Follow-Up Visit   Subjective  Devon Chavez is a 46 y.o. male who presents for the following: Annual Exam (History of dysplastic nevi - The patient presents for Total-Body Skin Exam (TBSE) for skin cancer screening and mole check.  The patient has spots, moles and lesions to be evaluated, some may be new or changing and the patient has concerns that these could be cancer./).  Accompanied by wife  The following portions of the chart were reviewed this encounter and updated as appropriate:   Tobacco  Allergies  Meds  Problems  Med Hx  Surg Hx  Fam Hx     Review of Systems:  No other skin or systemic complaints except as noted in HPI or Assessment and Plan.  Objective  Well appearing patient in no apparent distress; mood and affect are within normal limits.  A full examination was performed including scalp, head, eyes, ears, nose, lips, neck, chest, axillae, abdomen, back, buttocks, bilateral upper extremities, bilateral lower extremities, hands, feet, fingers, toes, fingernails, and toenails. All findings within normal limits unless otherwise noted below.  Left zygoma x 1, right cheek x 1 (2) Erythematous stuck-on, waxy papule or plaque  Right vertex scalp ~0.8 cm Subcutaneous nodule   Assessment & Plan   History of Dysplastic Nevi - No evidence of recurrence today - Recommend regular full body skin exams - Recommend daily broad spectrum sunscreen SPF 30+ to sun-exposed areas, reapply every 2 hours as needed.  - Call if any new or changing lesions are noted between office visits  Lentigines - Scattered tan macules - Due to sun exposure - Benign-appearing, observe - Recommend daily broad spectrum sunscreen SPF 30+ to sun-exposed areas, reapply every 2 hours as needed. - Call for any changes  Seborrheic Keratoses - Stuck-on, waxy, tan-brown papules and/or plaques  - Benign-appearing - Discussed benign etiology and prognosis. - Observe - Call for any  changes  Melanocytic Nevi - Tan-brown and/or pink-flesh-colored symmetric macules and papules - Benign appearing on exam today - Observation - Call clinic for new or changing moles - Recommend daily use of broad spectrum spf 30+ sunscreen to sun-exposed areas.   Hemangiomas - Red papules - Discussed benign nature - Observe - Call for any changes  Actinic Damage - Chronic condition, secondary to cumulative UV/sun exposure - diffuse scaly erythematous macules with underlying dyspigmentation - Recommend daily broad spectrum sunscreen SPF 30+ to sun-exposed areas, reapply every 2 hours as needed.  - Staying in the shade or wearing long sleeves, sun glasses (UVA+UVB protection) and wide brim hats (4-inch brim around the entire circumference of the hat) are also recommended for sun protection.  - Call for new or changing lesions.  Skin cancer screening performed today.  Inflamed seborrheic keratosis (2) Left zygoma x 1, right cheek x 1  Destruction of lesion - Left zygoma x 1, right cheek x 1 Complexity: simple   Destruction method: cryotherapy   Informed consent: discussed and consent obtained   Timeout:  patient name, date of birth, surgical site, and procedure verified Lesion destroyed using liquid nitrogen: Yes   Region frozen until ice ball extended beyond lesion: Yes   Outcome: patient tolerated procedure well with no complications   Post-procedure details: wound care instructions given    Pilar cyst Right vertex scalp  Discussed excision if it becomes symptomatic or gets bigger. Patient may consider.   Return in about 1 year (around 07/01/2023) for TBSE.  I, Ashok Cordia, CMA, am acting as scribe  for Sarina Ser, MD . Documentation: I have reviewed the above documentation for accuracy and completeness, and I agree with the above.  Sarina Ser, MD

## 2022-06-30 NOTE — Patient Instructions (Addendum)
Cryotherapy Aftercare  Wash gently with soap and water everyday.   Apply Vaseline and Band-Aid daily until healed.     Due to recent changes in healthcare laws, you may see results of your pathology and/or laboratory studies on MyChart before the doctors have had a chance to review them. We understand that in some cases there may be results that are confusing or concerning to you. Please understand that not all results are received at the same time and often the doctors may need to interpret multiple results in order to provide you with the best plan of care or course of treatment. Therefore, we ask that you please give us 2 business days to thoroughly review all your results before contacting the office for clarification. Should we see a critical lab result, you will be contacted sooner.   If You Need Anything After Your Visit  If you have any questions or concerns for your doctor, please call our main line at [phone] and press option 4 to reach your doctor's medical assistant. If no one answers, please leave a voicemail as directed and we will return your call as soon as possible. Messages left after 4 pm will be answered the following business day.   You may also send us a message via MyChart. We typically respond to MyChart messages within 1-2 business days.  For prescription refills, please ask your pharmacy to contact our office. Our fax number is [phone].  If you have an urgent issue when the clinic is closed that cannot wait until the next business day, you can page your doctor at the number below.    Please note that while we do our best to be available for urgent issues outside of office hours, we are not available 24/7.   If you have an urgent issue and are unable to reach us, you may choose to seek medical care at your doctor's office, retail clinic, urgent care center, or emergency room.  If you have a medical emergency, please immediately call 911 or go to the  emergency department.  Pager Numbers  - Dr. Kowalski: [phone]  - Dr. Moye: [phone]  - Dr. Stewart: [phone]  In the event of inclement weather, please call our main line at [phone] for an update on the status of any delays or closures.  Dermatology Medication Tips: Please keep the boxes that topical medications come in in order to help keep track of the instructions about where and how to use these. Pharmacies typically print the medication instructions only on the boxes and not directly on the medication tubes.   If your medication is too expensive, please contact our office at [phone] option 4 or send us a message through MyChart.   We are unable to tell what your co-pay for medications will be in advance as this is different depending on your insurance coverage. However, we may be able to find a substitute medication at lower cost or fill out paperwork to get insurance to cover a needed medication.   If a prior authorization is required to get your medication covered by your insurance company, please allow us 1-2 business days to complete this process.  Drug prices often vary depending on where the prescription is filled and some pharmacies may offer cheaper prices.  The website www.goodrx.com contains coupons for medications through different pharmacies. The prices here do not account for what the cost may be with help from insurance (it may be cheaper with your insurance), but the website can   give you the price if you did not use any insurance.  - You can print the associated coupon and take it with your prescription to the pharmacy.  - You may also stop by our office during regular business hours and pick up a GoodRx coupon card.  - If you need your prescription sent electronically to a different pharmacy, notify our office through [facility] MyChart or by phone at [phone] option 4.     Si Usted Necesita Algo Despus de Su Visita  Tambin puede  enviarnos un mensaje a travs de MyChart. Por lo general respondemos a los mensajes de MyChart en el transcurso de 1 a 2 das hbiles.  Para renovar recetas, por favor pida a su farmacia que se ponga en contacto con nuestra oficina. Nuestro nmero de fax es el [phone].  Si tiene un asunto urgente cuando la clnica est cerrada y que no puede esperar hasta el siguiente da hbil, puede llamar/localizar a su doctor(a) al nmero que aparece a continuacin.   Por favor, tenga en cuenta que aunque hacemos todo lo posible para estar disponibles para asuntos urgentes fuera del horario de oficina, no estamos disponibles las 24 horas del da, los 7 das de la semana.   Si tiene un problema urgente y no puede comunicarse con nosotros, puede optar por buscar atencin mdica  en el consultorio de su doctor(a), en una clnica privada, en un centro de atencin urgente o en una sala de emergencias.  Si tiene una emergencia mdica, por favor llame inmediatamente al 911 o vaya a la sala de emergencias.  Nmeros de bper  - Dr. Kowalski: [phone]  - Dra. Moye: [phone]  - Dra. Stewart: [phone]  En caso de inclemencias del tiempo, por favor llame a nuestra lnea principal al [phone] para una actualizacin sobre el estado de cualquier retraso o cierre.  Consejos para la medicacin en dermatologa: Por favor, guarde las cajas en las que vienen los medicamentos de uso tpico para ayudarle a seguir las instrucciones sobre dnde y cmo usarlos. Las farmacias generalmente imprimen las instrucciones del medicamento slo en las cajas y no directamente en los tubos del medicamento.   Si su medicamento es muy caro, por favor, pngase en contacto con nuestra oficina llamando al [phone] y presione la opcin 4 o envenos un mensaje a travs de MyChart.   No podemos decirle cul ser su copago por los medicamentos por adelantado ya que esto es diferente dependiendo de la cobertura de su seguro.  Sin embargo, es posible que podamos encontrar un medicamento sustituto a menor costo o llenar un formulario para que el seguro cubra el medicamento que se considera necesario.   Si se requiere una autorizacin previa para que su compaa de seguros cubra su medicamento, por favor permtanos de 1 a 2 das hbiles para completar este proceso.  Los precios de los medicamentos varan con frecuencia dependiendo del lugar de dnde se surte la receta y alguna farmacias pueden ofrecer precios ms baratos.  El sitio web www.goodrx.com tiene cupones para medicamentos de diferentes farmacias. Los precios aqu no tienen en cuenta lo que podra costar con la ayuda del seguro (puede ser ms barato con su seguro), pero el sitio web puede darle el precio si no utiliz ningn seguro.  - Puede imprimir el cupn correspondiente y llevarlo con su receta a la farmacia.  - Tambin puede pasar por nuestra oficina durante el horario de atencin regular y recoger una tarjeta de cupones de GoodRx.  -   Si necesita que su receta se enve electrnicamente a una farmacia diferente, informe a nuestra oficina a travs de MyChart de [facility] o por telfono llamando al [phone] y presione la opcin 4.  

## 2022-07-05 ENCOUNTER — Encounter: Payer: Self-pay | Admitting: Dermatology

## 2022-08-24 ENCOUNTER — Ambulatory Visit
Admission: EM | Admit: 2022-08-24 | Discharge: 2022-08-24 | Disposition: A | Discharge status: Home / Self Care | Payer: Commercial Managed Care - HMO | Attending: Urgent Care | Admitting: Urgent Care

## 2022-08-24 DIAGNOSIS — Z79899 Other long term (current) drug therapy: Secondary | ICD-10-CM | POA: Insufficient documentation

## 2022-08-24 DIAGNOSIS — R059 Cough, unspecified: Secondary | ICD-10-CM | POA: Diagnosis not present

## 2022-08-24 DIAGNOSIS — Z1152 Encounter for screening for COVID-19: Secondary | ICD-10-CM | POA: Insufficient documentation

## 2022-08-24 DIAGNOSIS — R6889 Other general symptoms and signs: Secondary | ICD-10-CM | POA: Diagnosis not present

## 2022-08-24 LAB — RESP PANEL BY RT-PCR (FLU A&B, COVID) ARPGX2
Influenza A by PCR: NEGATIVE
Influenza B by PCR: NEGATIVE
SARS Coronavirus 2 by RT PCR: NEGATIVE

## 2022-08-24 MED ORDER — BENZONATATE 100 MG PO CAPS: ORAL_CAPSULE | ORAL | 0 refills | Status: AC

## 2022-08-24 MED ORDER — OSELTAMIVIR PHOSPHATE 75 MG PO CAPS: 75.0000 mg | ORAL_CAPSULE | Freq: Two times a day (BID) | ORAL | 0 refills | Status: AC

## 2022-08-24 MED ORDER — HYDROCOD POLI-CHLORPHE POLI ER 10-8 MG/5ML PO SUER: 5.0000 mL | Freq: Two times a day (BID) | ORAL | 0 refills | Status: AC | PRN

## 2022-08-24 NOTE — ED Provider Notes (Signed)
Devon Chavez    CSN: [id number] Arrival date & time: 08/24/22  0857      History   Chief Complaint Chief Complaint  Patient presents with   Cough   Fatigue   Otalgia    HPI Devon Chavez is a 46 y.o. male.    Cough Associated symptoms: ear pain   Otalgia Associated symptoms: cough     Presents to urgent care with complaint of productive cough, bilateral ear pain, fatigue x 2 days.  He denies fever though endorses body aches.  No chills.  Past Medical History:  Diagnosis Date   Dysplastic nevus 09/04/2018   Left upper back med to mid scapula - moderate   Dysplastic nevus 09/04/2018   Left low back 1.0 cm lat to spine - moderate   GERD (gastroesophageal reflux disease)    History of gout    Hyperlipidemia    Hypertension     Patient Active Problem List   Diagnosis Date Noted   Panic attack 11/19/2011   Hypertension 06/28/2011   Gout attack 06/28/2011    History reviewed. No pertinent surgical history.     Home Medications    Prior to Admission medications   Medication Sig Start Date End Date Taking? Authorizing Provider  amLODipine (NORVASC) 5 MG tablet Take 1 tablet by mouth daily. 07/23/22  Yes [provider]  allopurinol (ZYLOPRIM) 100 MG tablet Take 100 mg by mouth daily. 10/31/17   [provider]  aspirin EC 81 MG tablet Take 81 mg by mouth daily.    [provider]  colchicine 0.6 MG tablet Take 0.6 mg by mouth daily as needed.    [provider]  escitalopram (LEXAPRO) 10 MG tablet Take 10 mg by mouth daily. 02/02/18   [provider]  fenofibrate 160 MG tablet Take 160 mg by mouth daily. 11/13/20   [provider]  lisinopril (PRINIVIL,ZESTRIL) 20 MG tablet TAKE 1 TABLET ONCE A DAY 05/14/13   Copland, Frederico Hamman, MD  lovastatin (MEVACOR) 20 MG tablet Take 10 mg by mouth every evening. 01/23/18   [provider]  omeprazole (PRILOSEC OTC) 20 MG tablet Take 20 mg by mouth as  needed.    [provider]    Family History Family History  Problem Relation Age of Onset   Hypertension Mother    Hypertension Father     Social History Social History   Tobacco Use   Smoking status: Never   Smokeless tobacco: Current    Types: Chew  Vaping Use   Vaping Use: Never used  Substance Use Topics   Alcohol use: Yes   Drug use: No     Allergies   Other   Review of Systems Review of Systems   Physical Exam Triage Vital Signs ED Triage Vitals [08/24/22 0942]  Enc Vitals Group     BP (!) 159/112     Pulse Rate 97     Resp 16     Temp 98.9 F (37.2 C)     Temp src      SpO2 98 %     Weight      Height      Head Circumference      Peak Flow      Pain Score 0     Pain Loc      Pain Edu?      Excl. in [place]?    No data found.  Updated Vital Signs BP (!) 159/112   Pulse  97   Temp 98.9 F (37.2 C)   Resp 16   SpO2 98%   Visual Acuity Right Eye Distance:   Left Eye Distance:   Bilateral Distance:    Right Eye Near:   Left Eye Near:    Bilateral Near:     Physical Exam Vitals reviewed.  Constitutional:      Appearance: Normal appearance.  HENT:     Right Ear: Tympanic membrane normal.     Left Ear: Tympanic membrane normal.  Neurological:     General: No focal deficit present.     Mental Status: He is alert and oriented to person, place, and time.  Psychiatric:        Mood and Affect: Mood normal.        Behavior: Behavior normal.      UC Treatments / Results  Labs (all labs ordered are listed, but only abnormal results are displayed) Labs Reviewed - No data to display  EKG   Radiology No results found.  Procedures Procedures (including critical care time)  Medications Ordered in UC Medications - No data to display  Initial Impression / Assessment and Plan / UC Course  I have reviewed the triage vital signs and the nursing notes.  Pertinent labs & imaging results that were available during my care of  the patient were reviewed by me and considered in my medical decision making (see chart for details).   Patient is afebrile here without recent antipyretics. Satting well on room air. Overall is ill appearing, well hydrated, without respiratory distress. Pulmonary exam is unremarkable.  CTAB without wheezes, rhonchi, rales.  TMs are WNL bilaterally.  Suspect viral process including influenza causing his sinus pressure with eustachian tube dysfunction.  Will treat presumptively for influenza A with Tamiflu pending results of his swab.  Providing benzonatate and Tussionex for cough.    Final Clinical Impressions(s) / UC Diagnoses   Final diagnoses:  None   Discharge Instructions   None    ED Prescriptions   None    PDMP not reviewed this encounter.   Rose Phi, [place] 08/24/22 1028

## 2022-08-24 NOTE — Discharge Instructions (Addendum)
You have been diagnosed with a viral upper respiratory infection based on your symptoms and exam. Viral illnesses cannot be treated with antibiotics - they are self limiting - and you should find your symptoms resolving within a few days. Get plenty of rest and non-caffeinated fluids.  We have performed a respiratory swab checking for COVID, and influenza. I have prescribed Tamiflu, antiviral therapy for influenza A, based on a presumptive diagnosis of influenza.  Someone will contact you after results of your swab are available with instructions to continue or stop this medication.  Prescribed cough medicine to use for daytime and nighttime.  We recommend you use over-the-counter medications for symptom control including Tylenol or ibuprofen for fever, chills or body aches, and cold/cough medication.  Saline mist spray is helpful for removing excess mucus from your nose.  Room humidifiers are helpful to ease breathing at night. You might also find relief of nasal/sinus congestion symptoms by using a nasal decongestant such as Sudafed sinus (pseudoephedrine).  You will need to obtain this medication from behind the pharmacist counter.  Speak to the pharmacist to verify that you are not duplicating medications with other over-the-counter formulations that you may be using.   Follow up here or with your primary care provider if your symptoms are worsening or not improving.

## 2022-08-24 NOTE — ED Triage Notes (Signed)
Pt. Presents to UC w/ c/o a productive cough, bilateral ear pain and fatigue for the past 2 days.

## 2023-04-05 ENCOUNTER — Other Ambulatory Visit: Payer: Self-pay | Admitting: Family Medicine

## 2023-04-05 DIAGNOSIS — R931 Abnormal findings on diagnostic imaging of heart and coronary circulation: Secondary | ICD-10-CM

## 2023-04-05 DIAGNOSIS — E782 Mixed hyperlipidemia: Secondary | ICD-10-CM

## 2023-04-14 ENCOUNTER — Other Ambulatory Visit: Payer: Commercial Managed Care - HMO

## 2023-04-21 ENCOUNTER — Ambulatory Visit
Admission: RE | Admit: 2023-04-21 | Discharge: 2023-04-21 | Disposition: A | Discharge status: Home / Self Care | Payer: Commercial Managed Care - HMO | Source: Ambulatory Visit | Attending: Family Medicine | Admitting: Family Medicine

## 2023-04-21 DIAGNOSIS — R931 Abnormal findings on diagnostic imaging of heart and coronary circulation: Secondary | ICD-10-CM | POA: Insufficient documentation

## 2023-04-21 DIAGNOSIS — E782 Mixed hyperlipidemia: Secondary | ICD-10-CM | POA: Insufficient documentation

## 2023-05-12 ENCOUNTER — Other Ambulatory Visit: Payer: Self-pay | Admitting: Internal Medicine

## 2023-05-12 DIAGNOSIS — R0789 Other chest pain: Secondary | ICD-10-CM

## 2023-05-12 DIAGNOSIS — I2089 Other forms of angina pectoris: Secondary | ICD-10-CM

## 2023-05-12 DIAGNOSIS — R931 Abnormal findings on diagnostic imaging of heart and coronary circulation: Secondary | ICD-10-CM

## 2023-05-17 ENCOUNTER — Encounter (HOSPITAL_COMMUNITY): Payer: Self-pay

## 2023-05-17 ENCOUNTER — Telehealth (HOSPITAL_COMMUNITY): Payer: Self-pay | Admitting: *Deleted

## 2023-05-17 MED ORDER — METOPROLOL TARTRATE 100 MG PO TABS: ORAL_TABLET | ORAL | 0 refills | Status: AC

## 2023-05-17 MED ORDER — IVABRADINE HCL 7.5 MG PO TABS: ORAL_TABLET | ORAL | 0 refills | Status: AC

## 2023-05-17 NOTE — Telephone Encounter (Signed)
Reaching out to patient to offer assistance regarding upcoming cardiac imaging study; pt verbalizes understanding of appt date/time, parking situation and where to check in, pre-test NPO status and medications ordered, and verified current allergies; name and call back number provided for further questions should they arise  Larey Brick RN Navigator Cardiac Imaging [facility] [phone] office [phone] cell  Patient to take 100mg  metoprolol tartrate + 15mg  ivabradine two hours prior to his cardiac CT scan.

## 2023-05-19 ENCOUNTER — Other Ambulatory Visit: Payer: Self-pay | Admitting: Internal Medicine

## 2023-05-19 ENCOUNTER — Ambulatory Visit
Admission: RE | Admit: 2023-05-19 | Discharge: 2023-05-19 | Disposition: A | Discharge status: Home / Self Care | Payer: Commercial Managed Care - HMO | Source: Ambulatory Visit | Attending: Internal Medicine | Admitting: Internal Medicine

## 2023-05-19 DIAGNOSIS — R931 Abnormal findings on diagnostic imaging of heart and coronary circulation: Secondary | ICD-10-CM | POA: Insufficient documentation

## 2023-05-19 DIAGNOSIS — I25118 Atherosclerotic heart disease of native coronary artery with other forms of angina pectoris: Secondary | ICD-10-CM

## 2023-05-19 DIAGNOSIS — R0789 Other chest pain: Secondary | ICD-10-CM | POA: Insufficient documentation

## 2023-05-19 DIAGNOSIS — I2089 Other forms of angina pectoris: Secondary | ICD-10-CM | POA: Diagnosis present

## 2023-05-19 MED ORDER — METOPROLOL TARTRATE 5 MG/5ML IV SOLN
10.0000 mg | Freq: Once | INTRAVENOUS | Status: AC | PRN
  Administered 2023-05-19: 10 mg via INTRAVENOUS

## 2023-05-19 MED ORDER — IOHEXOL 350 MG/ML SOLN
100.0000 mL | Freq: Once | INTRAVENOUS | Status: AC | PRN
  Administered 2023-05-19: 100 mL via INTRAVENOUS

## 2023-05-19 MED ORDER — SODIUM CHLORIDE 0.9 % IV BOLUS
150.0000 mL | Freq: Once | INTRAVENOUS | Status: AC
  Administered 2023-05-19: 150 mL via INTRAVENOUS

## 2023-05-19 MED ORDER — NITROGLYCERIN 0.4 MG SL SUBL
0.8000 mg | SUBLINGUAL_TABLET | Freq: Once | SUBLINGUAL | Status: AC
  Administered 2023-05-19: 0.8 mg via SUBLINGUAL

## 2023-05-19 MED ORDER — DILTIAZEM HCL 25 MG/5ML IV SOLN: 10.0000 mg | INTRAVENOUS | Status: DC | PRN

## 2023-05-19 NOTE — Progress Notes (Signed)
Patient tolerated procedure well. Ambulate w/o difficulty. Denies light headedness or being dizzy. Sitting in chair drinking water provided. Encouraged to drink extra water today and reasoning explained. Verbalized understanding. All questions answered. ABC intact. No further needs. Discharge from procedure area w/o issues.   °

## 2023-06-23 ENCOUNTER — Telehealth: Payer: Self-pay

## 2023-06-23 NOTE — Telephone Encounter (Signed)
Patient scheduled to see Dr. Mariah Milling on 06/27/23.  During chart prep I realized that patient recently seen at [facility] with a scheduled follow up.  Called patient to confirm need for cardiology appt with Dr. Mariah Milling.  Devon Chavez would like to cancel appt with Dr. Mariah Milling and continue follow up with [facility].    Appt cancelled.

## 2023-06-27 ENCOUNTER — Ambulatory Visit: Payer: Commercial Managed Care - HMO | Admitting: Cardiovascular Disease

## 2023-07-06 ENCOUNTER — Ambulatory Visit (INDEPENDENT_AMBULATORY_CARE_PROVIDER_SITE_OTHER): Payer: Managed Care, Other (non HMO) | Admitting: Dermatology

## 2023-07-06 ENCOUNTER — Encounter: Payer: Self-pay | Admitting: Dermatology

## 2023-07-06 DIAGNOSIS — L578 Other skin changes due to chronic exposure to nonionizing radiation: Secondary | ICD-10-CM

## 2023-07-06 DIAGNOSIS — D229 Melanocytic nevi, unspecified: Secondary | ICD-10-CM

## 2023-07-06 DIAGNOSIS — L814 Other melanin hyperpigmentation: Secondary | ICD-10-CM | POA: Diagnosis not present

## 2023-07-06 DIAGNOSIS — D1801 Hemangioma of skin and subcutaneous tissue: Secondary | ICD-10-CM

## 2023-07-06 DIAGNOSIS — W908XXA Exposure to other nonionizing radiation, initial encounter: Secondary | ICD-10-CM | POA: Diagnosis not present

## 2023-07-06 DIAGNOSIS — Z1283 Encounter for screening for malignant neoplasm of skin: Secondary | ICD-10-CM | POA: Diagnosis not present

## 2023-07-06 DIAGNOSIS — L821 Other seborrheic keratosis: Secondary | ICD-10-CM

## 2023-07-06 DIAGNOSIS — L729 Follicular cyst of the skin and subcutaneous tissue, unspecified: Secondary | ICD-10-CM

## 2023-07-06 DIAGNOSIS — L7211 Pilar cyst: Secondary | ICD-10-CM

## 2023-07-06 DIAGNOSIS — Z86018 Personal history of other benign neoplasm: Secondary | ICD-10-CM

## 2023-07-06 NOTE — Patient Instructions (Addendum)
Recommend daily broad spectrum sunscreen SPF 30+ to sun-exposed areas, reapply every 2 hours as needed. Call for new or changing lesions.  Staying in the shade or wearing long sleeves, sun glasses (UVA+UVB protection) and wide brim hats (4-inch brim around the entire circumference of the hat) are also recommended for sun protection.      Melanoma ABCDEs  Melanoma is the most dangerous type of skin cancer, and is the leading cause of death from skin disease.  You are more likely to develop melanoma if you: Have light-colored skin, light-colored eyes, or red or blond hair Spend a lot of time in the sun Tan regularly, either outdoors or in a tanning bed Have had blistering sunburns, especially during childhood Have a close family member who has had a melanoma Have atypical moles or large birthmarks  Early detection of melanoma is key since treatment is typically straightforward and cure rates are extremely high if we catch it early.   The first sign of melanoma is often a change in a mole or a new dark spot.  The ABCDE system is a way of remembering the signs of melanoma.  A for asymmetry:  The two halves do not match. B for border:  The edges of the growth are irregular. C for color:  A mixture of colors are present instead of an even brown color. D for diameter:  Melanomas are usually (but not always) greater than 6mm - the size of a pencil eraser. E for evolution:  The spot keeps changing in size, shape, and color.  Please check your skin once per month between visits. You can use a small mirror in front and a large mirror behind you to keep an eye on the back side or your body.   If you see any new or changing lesions before your next follow-up, please call to schedule a visit.  Please continue daily skin protection including broad spectrum sunscreen SPF 30+ to sun-exposed areas, reapplying every 2 hours as needed when you're outdoors.   Staying in the shade or wearing long sleeves,  sun glasses (UVA+UVB protection) and wide brim hats (4-inch brim around the entire circumference of the hat) are also recommended for sun protection.         Pre-Operative Instructions  You are scheduled for a surgical procedure at [facility]. We recommend you read the following instructions. If you have any questions or concerns, please call the office at [phone].  Shower and wash the entire body with soap and water the day of your surgery paying special attention to cleansing at and around the planned surgery site.  Avoid aspirin or aspirin containing products at least fourteen (14) days prior to your surgical procedure and for at least one week (7 Days) after your surgical procedure. If you take aspirin on a regular basis for heart disease or history of stroke or for any other reason, we may recommend you continue taking aspirin but please notify us if you take this on a regular basis. Aspirin can cause more bleeding to occur during surgery as well as prolonged bleeding and bruising after surgery.   Avoid other nonsteroidal pain medications at least one week prior to surgery and at least one week prior to your surgery. These include medications such as Ibuprofen (Motrin, Advil and Nuprin), Naprosyn, Voltaren, Relafen, etc. If medications are used for therapeutic reasons, please inform us as they can cause increased bleeding or prolonged bleeding during and bruising after surgical procedures.  Please advise [place] if you are taking any "blood thinner" medications such as Coumadin or Dipyridamole or Plavix or similar medications. These cause increased bleeding and prolonged bleeding during procedures and bruising after surgical procedures. We may have to consider discontinuing these medications briefly prior to and shortly after your surgery if safe to do so.   Please inform us of all medications you are currently taking. All medications that are taken regularly should be taken the  day of surgery as you always do. Nevertheless, we need to be informed of what medications you are taking prior to surgery to know whether they will affect the procedure or cause any complications.   Please inform us of any medication allergies. Also inform us of whether you have allergies to Latex or rubber products or whether you have had any adverse reaction to Lidocaine or Epinephrine.  Please inform us of any prosthetic or artificial body parts such as artificial heart valve, joint replacements, etc., or similar condition that might require preoperative antibiotics.   We recommend avoidance of alcohol at least two weeks prior to surgery and continued avoidance for at least two weeks after surgery.   We recommend discontinuation of tobacco smoking at least two weeks prior to surgery and continued abstinence for at least two weeks after surgery.  Do not plan strenuous exercise, strenuous work or strenuous lifting for approximately four weeks after your surgery.   We request if you are unable to make your scheduled surgical appointment, please call us at least a week in advance or as soon as you are aware of a problem so that we can cancel or reschedule the appointment.   You MAY TAKE TYLENOL (acetaminophen) for pain as it is not a blood thinner.   PLEASE PLAN TO BE IN TOWN FOR TWO WEEKS FOLLOWING SURGERY, THIS IS IMPORTANT SO YOU CAN BE CHECKED FOR DRESSING CHANGES, SUTURE REMOVAL AND TO MONITOR FOR POSSIBLE COMPLICATIONS.      Due to recent changes in healthcare laws, you may see results of your pathology and/or laboratory studies on MyChart before the doctors have had a chance to review them. We understand that in some cases there may be results that are confusing or concerning to you. Please understand that not all results are received at the same time and often the doctors may need to interpret multiple results in order to provide you with the best plan of care or course of treatment.  Therefore, we ask that you please give [place] 2 business days to thoroughly review all your results before contacting the office for clarification. Should we see a critical lab result, you will be contacted sooner.   If You Need Anything After Your Visit  If you have any questions or concerns for your doctor, please call our main line at [phone] and press option 4 to reach your doctor's medical assistant. If no one answers, please leave a voicemail as directed and we will return your call as soon as possible. Messages left after 4 pm will be answered the following business day.   You may also send [place] a message via MyChart. We typically respond to MyChart messages within 1-2 business days.  For prescription refills, please ask your pharmacy to contact our office. Our fax number is [phone].  If you have an urgent issue when the clinic is closed that cannot wait until the next business day, you can page your doctor at the number below.    Please note that while we do  our best to be available for urgent issues outside of office hours, we are not available 24/7.   If you have an urgent issue and are unable to reach [place], you may choose to seek medical care at your doctor's office, retail clinic, urgent care center, or emergency room.  If you have a medical emergency, please immediately call 911 or go to the emergency department.  Pager Numbers  - Dr. Gwen Pounds: [phone]  - Dr. Roseanne Reno: [phone]  - Dr. Katrinka Blazing: [phone]   In the event of inclement weather, please call our main line at [phone] for an update on the status of any delays or closures.  Dermatology Medication Tips: Please keep the boxes that topical medications come in in order to help keep track of the instructions about where and how to use these. Pharmacies typically print the medication instructions only on the boxes and not directly on the medication tubes.   If your medication is too expensive, please  contact our office at [phone] option 4 or send [place] a message through MyChart.   We are unable to tell what your co-pay for medications will be in advance as this is different depending on your insurance coverage. However, we may be able to find a substitute medication at lower cost or fill out paperwork to get insurance to cover a needed medication.   If a prior authorization is required to get your medication covered by your insurance company, please allow [place] 1-2 business days to complete this process.  Drug prices often vary depending on where the prescription is filled and some pharmacies may offer cheaper prices.  The website www.goodrx.com contains coupons for medications through different pharmacies. The prices here do not account for what the cost may be with help from insurance (it may be cheaper with your insurance), but the website can give you the price if you did not use any insurance.  - You can print the associated coupon and take it with your prescription to the pharmacy.  - You may also stop by our office during regular business hours and pick up a GoodRx coupon card.  - If you need your prescription sent electronically to a different pharmacy, notify our office through [facility] or by phone at [phone] option 4.     Si Usted Necesita Algo Despus de Su Visita  Tambin puede enviarnos un mensaje a travs de Clinical cytogeneticist. Por lo general respondemos a los mensajes de MyChart en el transcurso de 1 a 2 das hbiles.  Para renovar recetas, por favor pida a su farmacia que se ponga en contacto con nuestra oficina. Annie Sable de fax es [place] [phone].  Si tiene un asunto urgente cuando la clnica est cerrada y que no puede esperar hasta el siguiente da hbil, puede llamar/localizar a su doctor(a) al nmero que aparece a continuacin.   Por favor, tenga en cuenta que aunque hacemos todo lo posible para estar disponibles para asuntos urgentes fuera del horario de  [place], no estamos disponibles las 24 horas del da, los 7 [address]  [address] de la [place].   Si tiene un problema urgente y no puede comunicarse con nosotros, puede optar por buscar atencin mdica  en el consultorio de su doctor(a), en una clnica privada, en un centro de atencin urgente o en una sala de emergencias.  Si tiene Engineer, drilling, por favor llame inmediatamente al 911 o vaya a la sala de emergencias.  Nmeros de bper  - Dr. Gwen Pounds: [phone]  - Dra. Roseanne Reno:  [phone]  - Dr. Katrinka Blazing: [phone]   En caso de inclemencias del tiempo, por favor llame a Lacy Duverney principal al [phone] para una actualizacin sobre el [place] de cualquier retraso o cierre.  Consejos para la medicacin en dermatologa: Por favor, guarde las cajas en las que vienen los medicamentos de uso tpico para ayudarle a seguir las instrucciones sobre dnde y cmo usarlos. Las farmacias generalmente imprimen las instrucciones del medicamento slo en las cajas y no directamente en los tubos del [place].   Si su medicamento es muy caro, por favor, pngase en contacto con Rolm Gala llamando al [phone] y presione la opcin 4 o envenos un mensaje a travs de Clinical cytogeneticist.   No podemos decirle cul ser su copago por los medicamentos por adelantado ya que esto es diferente dependiendo de la cobertura de su seguro. Sin embargo, es posible que podamos encontrar un medicamento sustituto a Audiological scientist un formulario para que el seguro cubra el medicamento que se considera necesario.   Si se requiere una autorizacin previa para que su compaa de seguros [place] su medicamento, por favor permtanos de 1 a 2 das hbiles para completar [address]  Los precios de los medicamentos varan con frecuencia dependiendo del Environmental consultant de dnde se surte la receta y alguna farmacias pueden ofrecer precios ms baratos.  El sitio web www.goodrx.com tiene cupones para medicamentos de Health and safety inspector.  Los precios aqu no tienen en cuenta lo que podra costar con la ayuda del seguro (puede ser ms barato con su seguro), pero el sitio web puede darle el precio si no utiliz Tourist information centre manager.  - Puede imprimir el cupn correspondiente y llevarlo con su receta a la farmacia.  - Tambin puede pasar por nuestra oficina durante el horario de atencin regular y Education officer, museum una tarjeta de cupones de GoodRx.  - Si necesita que su receta se enve electrnicamente a una farmacia diferente, informe a nuestra oficina a travs de MyChart de [place] o por telfono llamando al [phone] y presione la opcin 4.

## 2023-07-06 NOTE — Progress Notes (Signed)
   Follow-Up Visit   Subjective  Devon Chavez is a 47 y.o. male who presents for the following: Skin Cancer Screening and Full Body Skin Exam. Hx of dysplastic nevi.   Cyst on scalp. States is getting larger. Bothersome.   The patient presents for Total-Body Skin Exam (TBSE) for skin cancer screening and mole check. The patient has spots, moles and lesions to be evaluated, some may be new or changing and the patient may have concern these could be cancer.    The following portions of the chart were reviewed this encounter and updated as appropriate: medications, allergies, medical history  Review of Systems:  No other skin or systemic complaints except as noted in HPI or Assessment and Plan.  Objective  Well appearing patient in no apparent distress; mood and affect are within normal limits.  A full examination was performed including scalp, head, eyes, ears, nose, lips, neck, chest, axillae, abdomen, back, buttocks, bilateral upper extremities, bilateral lower extremities, hands, feet, fingers, toes, fingernails, and toenails. All findings within normal limits unless otherwise noted below.   Relevant physical exam findings are noted in the Assessment and Plan.    Assessment & Plan   HISTORY OF DYSPLASTIC NEVI No evidence of recurrence today Recommend regular full body skin exams Recommend daily broad spectrum sunscreen SPF 30+ to sun-exposed areas, reapply every 2 hours as needed.  Call if any new or changing lesions are noted between office visits   SKIN CANCER SCREENING PERFORMED TODAY.  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  - Staying in the shade or wearing long sleeves, sun glasses (UVA+UVB protection) and wide brim hats (4-inch brim around the  entire circumference of the hat) are also recommended for sun protection.  - Call for new or changing lesions.  LENTIGINES, SEBORRHEIC KERATOSES, HEMANGIOMAS - Benign normal skin lesions - Benign-appearing - Call for any changes  MELANOCYTIC NEVI - Tan-brown and/or pink-flesh-colored symmetric macules and papules - Benign appearing on exam today - Observation - Call clinic for new or changing moles - Recommend daily use of broad spectrum spf 30+ sunscreen to sun-exposed areas.    Pilar Cyst Exam: Subcutaneous nodule, 1.5 cm, at right scalp.  Benign-appearing. 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 Discussed that a cyst is a benign growth that can grow over time and sometimes get irritated or inflamed. Recommend observation if it is not bothersome. Discussed option of surgical excision to remove it if it is growing, symptomatic, or other changes noted. Please call for new or changing lesions so they can be evaluated.     Return in about 1 year (around 07/05/2024) for TBSE, HxDN, Pilar cyst excision next available .  I, Lawson Radar, CMA, am acting as scribe for Armida Sans, MD.   Documentation: I have reviewed the above documentation for accuracy and completeness, and I agree with the above.  Armida Sans, MD

## 2023-07-19 ENCOUNTER — Ambulatory Visit: Payer: Managed Care, Other (non HMO) | Admitting: Dermatology

## 2023-07-19 ENCOUNTER — Telehealth: Payer: Self-pay

## 2023-07-19 DIAGNOSIS — L7211 Pilar cyst: Secondary | ICD-10-CM | POA: Diagnosis not present

## 2023-07-19 DIAGNOSIS — D492 Neoplasm of unspecified behavior of bone, soft tissue, and skin: Secondary | ICD-10-CM

## 2023-07-19 MED ORDER — MUPIROCIN 2 % EX OINT: 1.0000 | TOPICAL_OINTMENT | Freq: Every day | CUTANEOUS | 1 refills | Status: AC

## 2023-07-19 NOTE — Progress Notes (Signed)
   Follow-Up Visit   Subjective  Devon Chavez is a 47 y.o. male who presents for the following: Cyst vs other R scalp, pt presents for excision The patient has spots, moles and lesions to be evaluated, some may be new or changing and the patient may have concern these could be cancer.   The following portions of the chart were reviewed this encounter and updated as appropriate: medications, allergies, medical history  Review of Systems:  No other skin or systemic complaints except as noted in HPI or Assessment and Plan.  Objective  Well appearing patient in no apparent distress; mood and affect are within normal limits.   A focused examination was performed of the following areas: scalp  Relevant exam findings are noted in the Assessment and Plan.  R scalp Cystic pap 2.2cm    Assessment & Plan     Neoplasm of skin R scalp  Skin excision  Lesion length (cm):  2.2 Lesion width (cm):  2.2 Margin per side (cm):  0 Total excision diameter (cm):  2.2 Informed consent: discussed and consent obtained   Timeout: patient name, date of birth, surgical site, and procedure verified   Procedure prep:  Patient was prepped and draped in usual sterile fashion Prep type:  Isopropyl alcohol and povidone-iodine Anesthesia: the lesion was anesthetized in a standard fashion   Anesthetic:  1% lidocaine w/ epinephrine 1-100,000 buffered w/ 8.4% NaHCO3 (7cc lido w/ epi) Instrument used comment:  #15c blade Hemostasis achieved with: pressure   Hemostasis achieved with comment:  Electrocautery Outcome: patient tolerated procedure well with no complications   Post-procedure details: sterile dressing applied and wound care instructions given   Dressing type: bandage and pressure dressing (Mupirocin)    Skin repair Complexity:  Complex Final length (cm):  2.2 Reason for type of repair: reduce tension to allow closure, reduce the risk of dehiscence, infection, and necrosis, reduce  subcutaneous dead space and avoid a hematoma, allow closure of the large defect, preserve normal anatomy, preserve normal anatomical and functional relationships and enhance both functionality and cosmetic results   Undermining: area extensively undermined   Undermining comment:  Undermining Defect 2.2cm Subcutaneous layers (deep stitches):  Suture size:  4-0 Suture type: Vicryl (polyglactin 910)   Subcutaneous suture technique: Inverted Dermal. Fine/surface layer approximation (top stitches):  Suture size:  3-0 Suture type: nylon   Stitches: horizontal mattress and simple interrupted   Stitches comment:  Horizontal mattress x 1, simple interrupted x 2 Total of 3 sutures Suture removal (days):  7 Hemostasis achieved with: pressure Outcome: patient tolerated procedure well with no complications   Post-procedure details: sterile dressing applied and wound care instructions given   Dressing type: bandage, pressure dressing and bacitracin (Mupirocin)    mupirocin ointment (BACTROBAN) 2 % Apply 1 Application topically daily. Qd to excision site  Specimen 1 - Surgical pathology Differential Diagnosis: D48.5 Cyst vs other  Check Margins: No Cystic pap 2.2cm  Cyst vs other, excised today Start Mupirocin oint qd to excision site    Return in about 1 week (around 07/26/2023) for suture removal.  I, Ardis Rowan, RMA, am acting as scribe for Armida Sans, MD .   Documentation: I have reviewed the above documentation for accuracy and completeness, and I agree with the above.  Armida Sans, MD

## 2023-07-19 NOTE — Patient Instructions (Addendum)
Wound Care Instructions  On the day following your surgery, you should begin doing daily dressing changes: Remove the old dressing and discard it. Cleanse the wound gently with tap water. This may be done in the shower or by placing a wet gauze pad directly on the wound and letting it soak for several minutes. It is important to gently remove any dried blood from the wound in order to encourage healing. This may be done by gently rolling a moistened Q-tip on the dried blood. Do not pick at the wound. If the wound should start to bleed, continue cleaning the wound, then place a moist gauze pad on the wound and hold pressure for a few minutes.  Make sure you then dry the skin surrounding the wound completely or the tape will not stick to the skin. Do not use cotton balls on the wound. After the wound is clean and dry, apply the ointment gently with a Q-tip. Cut a non-stick pad to fit the size of the wound. Lay the pad flush to the wound. If the wound is draining, you may want to reinforce it with a small amount of gauze on top of the non-stick pad for a little added compression to the area. Use the tape to seal the area completely. Select from the following with respect to your individual situation: If your wound has been stitched closed: continue the above steps 1-8 at least daily until your sutures are removed. If your wound has been left open to heal: continue steps 1-8 at least daily for the first 3-4 weeks. We would like for you to take a few extra precautions for at least the next week. Sleep with your head elevated on pillows if our wound is on your head. Do not bend over or lift heavy items to reduce the chance of elevated blood pressure to the wound Do not participate in particularly strenuous activities.   Below is a list of dressing supplies you might need.  Cotton-tipped applicators - Q-tips Gauze pads (2x2 and/or 4x4) - All-Purpose Sponges Non-stick dressing material - Telfa Tape -  Paper or Hypafix New and clean tube of petroleum jelly - Vaseline    Comments on Post-Operative Period Slight swelling and redness often appear around the wound. This is normal and will disappear within several days following the surgery. The healing wound will drain a brownish-red-yellow discharge during healing. This is a normal phase of wound healing. As the wound begins to heal, the drainage may increase in amount. Again, this drainage is normal. Notify us if the drainage becomes persistently bloody, excessively swollen, or intensely painful or develops a foul odor or red streaks.  If you should experience mild discomfort during the healing phase, you may take an aspirin-free medication such as Tylenol (acetaminophen). Notify us if the discomfort is severe or persistent. Avoid alcoholic beverages when taking pain medicine.  In Case of Wound Hemorrhage A wound hemorrhage is when the bandage suddenly becomes soaked with bright red blood and flows profusely. If this happens, sit down or lie down with your head elevated. If the wound has a dressing on it, do not remove the dressing. Apply pressure to the existing gauze. If the wound is not covered, use a gauze pad to apply pressure and continue applying the pressure for 20 minutes without peeking. DO NOT COVER THE WOUND WITH A LARGE TOWEL OR WASH CLOTH. Release your hand from the wound site but do not remove the dressing. If the bleeding has stopped,  gently clean around the wound. Leave the dressing in place for 24 hours if possible. This wait time allows the blood vessels to close off so that you do not spark a new round of bleeding by disrupting the newly clotted blood vessels with an immediate dressing change. If the bleeding does not subside, continue to hold pressure. If matters are out of your control, contact an After Hours clinic or go to the Emergency Room.  Due to recent changes in healthcare laws, you may see results of your pathology and/or  laboratory studies on MyChart before the doctors have had a chance to review them. We understand that in some cases there may be results that are confusing or concerning to you. Please understand that not all results are received at the same time and often the doctors may need to interpret multiple results in order to provide you with the best plan of care or course of treatment. Therefore, we ask that you please give [place] 2 business days to thoroughly review all your results before contacting the office for clarification. Should we see a critical lab result, you will be contacted sooner.   If You Need Anything After Your Visit  If you have any questions or concerns for your doctor, please call our main line at [phone] and press option 4 to reach your doctor's medical assistant. If no one answers, please leave a voicemail as directed and we will return your call as soon as possible. Messages left after 4 pm will be answered the following business day.   You may also send [place] a message via MyChart. We typically respond to MyChart messages within 1-2 business days.  For prescription refills, please ask your pharmacy to contact our office. Our fax number is [phone].  If you have an urgent issue when the clinic is closed that cannot wait until the next business day, you can page your doctor at the number below.    Please note that while we do our best to be available for urgent issues outside of office hours, we are not available 24/7.   If you have an urgent issue and are unable to reach [place], you may choose to seek medical care at your doctor's office, retail clinic, urgent care center, or emergency room.  If you have a medical emergency, please immediately call 911 or go to the emergency department.  Pager Numbers  - Dr. Gwen Pounds: [phone]  - Dr. Roseanne Reno: [phone]  - Dr. Katrinka Blazing: [phone]   In the event of inclement weather, please call our main line at [phone] for an  update on the status of any delays or closures.  Dermatology Medication Tips: Please keep the boxes that topical medications come in in order to help keep track of the instructions about where and how to use these. Pharmacies typically print the medication instructions only on the boxes and not directly on the medication tubes.   If your medication is too expensive, please contact our office at [phone] option 4 or send [place] a message through MyChart.   We are unable to tell what your co-pay for medications will be in advance as this is different depending on your insurance coverage. However, we may be able to find a substitute medication at lower cost or fill out paperwork to get insurance to cover a needed medication.   If a prior authorization is required to get your medication covered by your insurance company, please allow [place] 1-2 business days to complete this process.  Drug prices often vary depending on where the prescription is filled and some pharmacies may offer cheaper prices.  The website www.goodrx.com contains coupons for medications through different pharmacies. The prices here do not account for what the cost may be with help from insurance (it may be cheaper with your insurance), but the website can give you the price if you did not use any insurance.  - You can print the associated coupon and take it with your prescription to the pharmacy.  - You may also stop by our office during regular business hours and pick up a GoodRx coupon card.  - If you need your prescription sent electronically to a different pharmacy, notify our office through [facility] or by phone at [phone] option 4.     Si Usted Necesita Algo Despus de Su Visita  Tambin puede enviarnos un mensaje a travs de Clinical cytogeneticist. Por lo general respondemos a los mensajes de MyChart en el transcurso de 1 a 2 das hbiles.  Para renovar recetas, por favor pida a su farmacia que se ponga en contacto con  nuestra oficina. Annie Sable de fax es [place] [phone].  Si tiene un asunto urgente cuando la clnica est cerrada y que no puede esperar hasta el siguiente da hbil, puede llamar/localizar a su doctor(a) al nmero que aparece a continuacin.   Por favor, tenga en cuenta que aunque hacemos todo lo posible para estar disponibles para asuntos urgentes fuera del horario de [place], no estamos disponibles las 24 horas del da, los 7 [address]  [address] de la [place].   Si tiene un problema urgente y no puede comunicarse con nosotros, puede optar por buscar atencin mdica  en el consultorio de su doctor(a), en una clnica privada, en un centro de atencin urgente o en una sala de emergencias.  Si tiene Engineer, drilling, por favor llame inmediatamente al 911 o vaya a la sala de emergencias.  Nmeros de bper  - Dr. Gwen Pounds: [phone]  - Dra. Roseanne Reno: [phone]  - Dr. Katrinka Blazing: [phone]   En caso de inclemencias del tiempo, por favor llame a Lacy Duverney principal al [phone] para una actualizacin sobre el [place] de cualquier retraso o cierre.  Consejos para la medicacin en dermatologa: Por favor, guarde las cajas en las que vienen los medicamentos de uso tpico para ayudarle a seguir las instrucciones sobre dnde y cmo usarlos. Las farmacias generalmente imprimen las instrucciones del medicamento slo en las cajas y no directamente en los tubos del [place].   Si su medicamento es muy caro, por favor, pngase en contacto con Rolm Gala llamando al [phone] y presione la opcin 4 o envenos un mensaje a travs de Clinical cytogeneticist.   No podemos decirle cul ser su copago por los medicamentos por adelantado ya que esto es diferente dependiendo de la cobertura de su seguro. Sin embargo, es posible que podamos encontrar un medicamento sustituto a Audiological scientist un formulario para que el seguro cubra el medicamento que se considera necesario.   Si se requiere una autorizacin  previa para que su compaa de seguros [place] su medicamento, por favor permtanos de 1 a 2 das hbiles para completar [address]  Los precios de los medicamentos varan con frecuencia dependiendo del Environmental consultant de dnde se surte la receta y alguna farmacias pueden ofrecer precios ms baratos.  El sitio web www.goodrx.com tiene cupones para medicamentos de Health and safety inspector. Los precios aqu no tienen en cuenta lo que podra costar con la ayuda del seguro (puede ser  ms barato con su seguro), pero el sitio web puede darle el precio si no Visual merchandiser.  - Puede imprimir el cupn correspondiente y llevarlo con su receta a la farmacia.  - Tambin puede pasar por nuestra oficina durante el horario de atencin regular y Education officer, museum una tarjeta de cupones de GoodRx.  - Si necesita que su receta se enve electrnicamente a una farmacia diferente, informe a nuestra oficina a travs de MyChart de [place] o por telfono llamando al [phone] y presione la opcin 4.

## 2023-07-19 NOTE — Telephone Encounter (Signed)
Patient doing well after today's surgery./sh 

## 2023-07-22 LAB — SURGICAL PATHOLOGY

## 2023-07-26 ENCOUNTER — Ambulatory Visit (INDEPENDENT_AMBULATORY_CARE_PROVIDER_SITE_OTHER): Payer: Managed Care, Other (non HMO) | Admitting: Dermatology

## 2023-07-26 ENCOUNTER — Encounter: Payer: Self-pay | Admitting: Dermatology

## 2023-07-26 DIAGNOSIS — L729 Follicular cyst of the skin and subcutaneous tissue, unspecified: Secondary | ICD-10-CM

## 2023-07-26 NOTE — Patient Instructions (Signed)

## 2023-07-26 NOTE — Progress Notes (Signed)
   Follow-Up Visit   Subjective  Devon Chavez is a 47 y.o. male who presents for the following: Suture removal for benign cyst of the R scalp  Pathology showed a benign cyst  The following portions of the chart were reviewed this encounter and updated as appropriate: medications, allergies, medical history  Review of Systems:  No other skin or systemic complaints except as noted in HPI or Assessment and Plan.  Objective  Well appearing patient in no apparent distress; mood and affect are within normal limits.  Areas Examined: The scalp  Relevant physical exam findings are noted in the Assessment and Plan.    Assessment & Plan    Encounter for Removal of Sutures - Incision site at R scalp is clean, dry and intact. - Wound cleansed, sutures removed, wound cleansed and steri strips applied.  - Discussed pathology results showing a benign cyst - Patient advised to keep steri-strips dry until they fall off. - Scars remodel for a full year. - Once steri-strips fall off, patient can apply over-the-counter silicone scar cream once to twice a day to help with scar remodeling if desired. - Patient advised to call with any concerns or if they notice any new or changing lesions.  Return for appointment as scheduled.    Documentation: I have reviewed the above documentation for accuracy and completeness, and I agree with the above.  Armida Sans, MD

## 2024-06-05 ENCOUNTER — Other Ambulatory Visit: Payer: Self-pay | Admitting: Sports Medicine

## 2024-06-05 DIAGNOSIS — M7541 Impingement syndrome of right shoulder: Secondary | ICD-10-CM

## 2024-06-05 DIAGNOSIS — M7551 Bursitis of right shoulder: Secondary | ICD-10-CM

## 2024-06-05 DIAGNOSIS — M7581 Other shoulder lesions, right shoulder: Secondary | ICD-10-CM

## 2024-06-05 DIAGNOSIS — G8929 Other chronic pain: Secondary | ICD-10-CM

## 2024-07-11 ENCOUNTER — Ambulatory Visit (INDEPENDENT_AMBULATORY_CARE_PROVIDER_SITE_OTHER): Payer: Managed Care, Other (non HMO) | Admitting: Dermatology

## 2024-07-11 DIAGNOSIS — Z7189 Other specified counseling: Secondary | ICD-10-CM

## 2024-07-11 DIAGNOSIS — L821 Other seborrheic keratosis: Secondary | ICD-10-CM

## 2024-07-11 DIAGNOSIS — L578 Other skin changes due to chronic exposure to nonionizing radiation: Secondary | ICD-10-CM

## 2024-07-11 DIAGNOSIS — Z86018 Personal history of other benign neoplasm: Secondary | ICD-10-CM

## 2024-07-11 DIAGNOSIS — D229 Melanocytic nevi, unspecified: Secondary | ICD-10-CM

## 2024-07-11 DIAGNOSIS — Z1283 Encounter for screening for malignant neoplasm of skin: Secondary | ICD-10-CM | POA: Diagnosis not present

## 2024-07-11 DIAGNOSIS — W908XXA Exposure to other nonionizing radiation, initial encounter: Secondary | ICD-10-CM

## 2024-07-11 DIAGNOSIS — L814 Other melanin hyperpigmentation: Secondary | ICD-10-CM | POA: Diagnosis not present

## 2024-07-11 DIAGNOSIS — D1801 Hemangioma of skin and subcutaneous tissue: Secondary | ICD-10-CM

## 2024-07-11 DIAGNOSIS — L738 Other specified follicular disorders: Secondary | ICD-10-CM

## 2024-07-11 NOTE — Progress Notes (Signed)
   Follow-Up Visit   Subjective  Devon Chavez is a 48 y.o. male who presents for the following: Skin Cancer Screening and Full Body Skin Exam  The patient presents for Total-Body Skin Exam (TBSE) for skin cancer screening and mole check. The patient has spots, moles and lesions to be evaluated, some may be new or changing and the patient may have concern these could be cancer.  The following portions of the chart were reviewed this encounter and updated as appropriate: medications, allergies, medical history  Review of Systems:  No other skin or systemic complaints except as noted in HPI or Assessment and Plan.  Objective  Well appearing patient in no apparent distress; mood and affect are within normal limits.  A full examination was performed including scalp, head, eyes, ears, nose, lips, neck, chest, axillae, abdomen, back, buttocks, bilateral upper extremities, bilateral lower extremities, hands, feet, fingers, toes, fingernails, and toenails. All findings within normal limits unless otherwise noted below.   Relevant physical exam findings are noted in the Assessment and Plan.    Assessment & Plan   SKIN CANCER SCREENING PERFORMED TODAY.  ACTINIC DAMAGE - Chronic condition, secondary to cumulative UV/sun exposure - diffuse scaly erythematous macules with underlying dyspigmentation - Recommend daily broad spectrum sunscreen SPF 30+ to sun-exposed areas, reapply every 2 hours as needed.  - Staying in the shade or wearing long sleeves, sun glasses (UVA+UVB protection) and wide brim hats (4-inch brim around the entire circumference of the hat) are also recommended for sun protection.  - Call for new or changing lesions.  LENTIGINES, SEBORRHEIC KERATOSES, HEMANGIOMAS - Benign normal skin lesions - Benign-appearing - Call for any changes  MELANOCYTIC NEVI - Tan-brown and/or pink-flesh-colored symmetric macules and papules - Benign appearing on exam today - Observation - Call  clinic for new or changing moles - Recommend daily use of broad spectrum spf 30+ sunscreen to sun-exposed areas.   HISTORY OF DYSPLASTIC NEVI No evidence of recurrence today Recommend regular full body skin exams Recommend daily broad spectrum sunscreen SPF 30+ to sun-exposed areas, reapply every 2 hours as needed.  Call if any new or changing lesions are noted between office visits  Sebaceous Hyperplasia  Sebaceous Hyperplasia - Small yellow papules with a central dell 0.2 cm L cheek - Benign-appearing - Observe. Call for changes.  Return in about 1 year (around 07/11/2025) for TBSE - hx dysplastic nevi.  Devon Chavez, CMA, am acting as scribe for Alm Rhyme, MD .   Documentation: I have reviewed the above documentation for accuracy and completeness, and I agree with the above.  Alm Rhyme, MD

## 2024-07-11 NOTE — Patient Instructions (Signed)

## 2024-07-17 ENCOUNTER — Encounter: Payer: Self-pay | Admitting: Dermatology

## 2025-07-18 ENCOUNTER — Ambulatory Visit: Admitting: Dermatology
